# Patient Record
Sex: Female | Born: 1985 | Race: White | Hispanic: No | Marital: Married | Smoking: Never smoker
Health system: Southern US, Community
[De-identification: ages and names within clinical notes are randomized; demographics above are authoritative.]

## PROBLEM LIST (undated history)

## (undated) DIAGNOSIS — K219 Gastro-esophageal reflux disease without esophagitis: Secondary | ICD-10-CM

## (undated) DIAGNOSIS — E039 Hypothyroidism, unspecified: Secondary | ICD-10-CM

## (undated) DIAGNOSIS — F419 Anxiety disorder, unspecified: Secondary | ICD-10-CM

## (undated) DIAGNOSIS — E785 Hyperlipidemia, unspecified: Secondary | ICD-10-CM

## (undated) DIAGNOSIS — Z8489 Family history of other specified conditions: Secondary | ICD-10-CM

## (undated) DIAGNOSIS — R519 Headache, unspecified: Secondary | ICD-10-CM

## (undated) DIAGNOSIS — D649 Anemia, unspecified: Secondary | ICD-10-CM

## (undated) DIAGNOSIS — Z01419 Encounter for gynecological examination (general) (routine) without abnormal findings: Secondary | ICD-10-CM

## (undated) DIAGNOSIS — E559 Vitamin D deficiency, unspecified: Secondary | ICD-10-CM

## (undated) DIAGNOSIS — E669 Obesity, unspecified: Secondary | ICD-10-CM

## (undated) HISTORY — DX: Gastro-esophageal reflux disease without esophagitis: K21.9

## (undated) HISTORY — DX: Obesity, unspecified: E66.9

## (undated) HISTORY — DX: Hypothyroidism, unspecified: E03.9

## (undated) HISTORY — DX: Encounter for gynecological examination (general) (routine) without abnormal findings: Z01.419

---

## 2008-05-10 HISTORY — PX: UPPER GASTROINTESTINAL ENDOSCOPY: SHX188

## 2009-02-02 ENCOUNTER — Emergency Department: Payer: Self-pay | Admitting: Internal Medicine

## 2009-02-21 ENCOUNTER — Ambulatory Visit: Payer: Self-pay | Admitting: Gastroenterology

## 2009-05-10 DIAGNOSIS — Z01419 Encounter for gynecological examination (general) (routine) without abnormal findings: Secondary | ICD-10-CM

## 2009-05-10 HISTORY — DX: Encounter for gynecological examination (general) (routine) without abnormal findings: Z01.419

## 2009-12-19 LAB — HM PAP SMEAR: HM Pap smear: NORMAL

## 2010-12-14 ENCOUNTER — Ambulatory Visit: Payer: Self-pay

## 2011-01-22 ENCOUNTER — Ambulatory Visit (INDEPENDENT_AMBULATORY_CARE_PROVIDER_SITE_OTHER): Payer: BC Managed Care – PPO | Admitting: Internal Medicine

## 2011-01-22 ENCOUNTER — Encounter: Payer: Self-pay | Admitting: Internal Medicine

## 2011-01-22 DIAGNOSIS — N920 Excessive and frequent menstruation with regular cycle: Secondary | ICD-10-CM

## 2011-01-22 DIAGNOSIS — J329 Chronic sinusitis, unspecified: Secondary | ICD-10-CM

## 2011-01-22 DIAGNOSIS — E039 Hypothyroidism, unspecified: Secondary | ICD-10-CM | POA: Insufficient documentation

## 2011-01-22 MED ORDER — LEVOTHYROXINE SODIUM 50 MCG PO TABS: 50.0000 ug | ORAL_TABLET | Freq: Every day | ORAL | Status: DC

## 2011-01-22 MED ORDER — AMOXICILLIN-POT CLAVULANATE 875-125 MG PO TABS: 1.0000 | ORAL_TABLET | Freq: Two times a day (BID) | ORAL | Status: AC

## 2011-01-22 NOTE — Progress Notes (Signed)
Subjective:    Patient ID: Kristi [name], female    DOB: [birth year], 25 y.o.   MRN: [id number]  HPI Kristi [name] is a 25 year old female who presents to establish care. She was recently seen in employee health at [facility] and had lab work which showed an elevated TSH of 10. She reports a long-standing history of fatigue and difficulty losing weight. She reports being tested for hypothyroidism in the past however this was several years ago. She also notes intolerance to temperature, noting that she is always cold. She denies any constipation. She notes very heavy periods which last for 7 days. She has a strong family history of hypothyroidism including her mother.  She is also concerned because over the last week she has had green purulent nasal drainage. She also describes sinus pressure. She denies any fever. She has a history of recurrent sinusitis in her symptoms are consistent with her typical sinusitis symptoms.  Kristi [name] also reports that she occasionally notes that her toes turn purple color when she crosses her legs a certain way or in cold environments. She notes that her grandmother has similar symptoms. She denies any pain in her feet with the color change. She denies any pain or color change in her hands.    Review of Systems  Constitutional: Positive for fatigue and unexpected weight change (difficulty losing weight). Negative for fever, chills and appetite change.  HENT: Positive for congestion, rhinorrhea (purulent), postnasal drip and sinus pressure. Negative for ear pain, sore throat, trouble swallowing, neck pain and voice change.   Eyes: Negative for visual disturbance.  Respiratory: Negative for cough, shortness of breath, wheezing and stridor.   Cardiovascular: Negative for chest pain, palpitations and leg swelling.  Gastrointestinal: Negative for nausea, vomiting, abdominal pain, diarrhea, constipation, blood in stool, abdominal  distention and anal bleeding.  Genitourinary: Negative for dysuria and flank pain.  Musculoskeletal: Negative for myalgias, arthralgias and gait problem.  Skin: Negative for color change and rash.  Neurological: Negative for dizziness and headaches.  Hematological: Negative for adenopathy. Does not bruise/bleed easily.  Psychiatric/Behavioral: Negative for suicidal ideas, sleep disturbance and dysphoric mood. The patient is not nervous/anxious.    BP 122/82  Pulse 85  Temp(Src) 98.2 F (36.8 C) (Oral)  Resp 20  Ht 5\' 9"  (1.753 m)  Wt 207 lb 4 oz (94.008 kg)  BMI 30.61 kg/m2  SpO2 100%  LMP 01/08/2011     Objective:   Physical Exam  Constitutional: She is oriented to person, place, and time. She appears well-developed and well-nourished. No distress.  HENT:  Head: Normocephalic and atraumatic.  Right Ear: Hearing, tympanic membrane, external ear and ear canal normal.  Left Ear: Hearing, tympanic membrane, external ear and ear canal normal.  Nose: Mucosal edema present. Right sinus exhibits no maxillary sinus tenderness and no frontal sinus tenderness. Left sinus exhibits no maxillary sinus tenderness and no frontal sinus tenderness.  Mouth/Throat: Oropharynx is clear and moist and mucous membranes are normal. No oropharyngeal exudate or posterior oropharyngeal erythema.  Eyes: Conjunctivae and EOM are normal. Pupils are equal, round, and reactive to light. Right eye exhibits no discharge. Left eye exhibits no discharge. No scleral icterus.  Neck: Trachea normal and normal range of motion. Neck supple. Carotid bruit is not present. No tracheal deviation present. No mass and no thyromegaly present.  Cardiovascular: Normal rate, regular rhythm, normal heart sounds and intact distal pulses.  Exam reveals no gallop and no friction rub.   No murmur  heard. Pulmonary/Chest: Effort normal and breath sounds normal. No respiratory distress. She has no wheezes. She has no rales. She exhibits no  tenderness.  Abdominal: Soft. Normal appearance. There is no hepatosplenomegaly. There is no tenderness.  Musculoskeletal: Normal range of motion. She exhibits no edema and no tenderness.  Lymphadenopathy:    She has no cervical adenopathy.  Neurological: She is alert and oriented to person, place, and time. She has normal strength. No cranial nerve deficit. She exhibits normal muscle tone. Coordination normal.  Skin: Skin is warm and dry. No rash noted. She is not diaphoretic. No erythema. No pallor.  Psychiatric: She has a normal mood and affect. Her behavior is normal. Judgment and thought content normal.          Assessment & Plan:  1. Hypothyroidism -  patient with new diagnosis of hypothyroidism based on screening labs performed at employee health. Will start treatment with Synthroid 50 mcg daily. She was instructed to take this medication with water before breakfast. We discussed the symptoms and causes of hypothyroidism. We will plan to repeat a TSH in 6 weeks. She will followup in a week's period   2. Sinusitis - patient with acute maxillary sinusitis. Will treat with Augmentin. She will use ibuprofen for pain and inflammation. She will call if symptoms fail to improve over the next several days.  3. Raynaud's - patient's symptom of purple discoloration of her toes in cold temperatures is most consistent with Raynaud's. We discussed avoiding cold temperatures. Should she develop any pain or worsening of her symptoms we will consider additional evaluation.   4. Menorrhagia - patient with extremely heavy menstrual cycles lasting 7 days each month. We will check a screening CBC to look for iron deficiency with next lab work. She has tried using oral contraceptives to help control menstrual flow in the past, but had symptoms of lightheadedness when taking birth control pills. Suspect that her menstrual cycles may improve with treatment of hypothyroidism.

## 2011-01-22 NOTE — Patient Instructions (Signed)
Hypothyroidism The thyroid is a large gland located in the lower front of your neck. The thyroid gland helps control metabolism. Metabolism is how your body handles food. It controls metabolism with the hormone thyroxine. When this gland is underactive (hypothyroid), it produces too little hormone.  SYMPTOMS OF HYPOTHYROIDISM  Lethargy (feeling as though you have no energy)   Cold intolerance   Weight gain (in spite of normal food intake)   Dry skin   Coarse hair   Menstrual irregularity (if severe, may lead to infertility)   Slowing of thought processes  Cardiac problems are also caused by insufficient amounts of thyroid hormone. Hypothyroidism in the newborn is cretinism, and is an extreme form. It is important that this form be treated adequately and immediately or it will lead rapidly to retarded physical and mental development. CAUSES OF HYPOTHYROIDISM These include:   Absence or destruction of thyroid tissue.  Goiter due to iodine deficiency.   Goiter due to medications.   Congenital defects (since birth).  Problems with the pituitary. This causes a lack of TSH (thyroid stimulating hormone). This hormone tells the thyroid to turn out more hormone.   DIAGNOSIS To prove hypothyroidism, your caregiver may do blood tests and ultrasound tests. Sometimes the signs are hidden. It may be necessary for your caregiver to watch this illness with blood tests either before or after diagnosis and treatment. TREATMENT  Low levels of thyroid hormone are increased by using synthetic thyroid hormone. This is a safe, effective treatment. It usually takes about four weeks to gain the full effects of the medication. After you have the full effect of the medication, it will generally take another four weeks for problems to leave. Your caregiver may start you on low doses. If you have had heart problems the dose may be gradually increased. It is generally not an emergency to get rapidly to  normal. HOME CARE INSTRUCTIONS  Take your medications as your caregiver suggests. Let your caregiver know of any medications you are taking or start taking. Your caregiver will help you with dosage schedules.   As your condition improves, your dosage needs may increase. It will be necessary to have continuing blood tests as suggested by your caregiver.   Report all suspected medication side effects to your caregiver.  SEEK MEDICAL CARE IF YOU DEVELOP:  Sweating.  Tremulousness (tremors).   Anxiety.   Rapid weight loss.   Heat intolerance.  Emotional swings.   Diarrhea.   Weakness.   SEEK IMMEDIATE MEDICAL CARE IF: You develop chest pain, an irregular heart beat (palpitations), or a rapid heart beat. MAKE SURE YOU:   Understand these instructions.   Will watch your condition.   Will get help right away if you are not doing well or get worse.  Document Released: 04/26/2005 Document Re-Released: 04/08/2008 Exit[employer] Patient Information 2011 [employer]. 

## 2011-02-04 ENCOUNTER — Telehealth: Payer: Self-pay | Admitting: Internal Medicine

## 2011-02-04 MED ORDER — NORETHIN ACE-ETH ESTRAD-FE 1-20 MG-MCG(24) PO TABS: 1.0000 | ORAL_TABLET | Freq: Every day | ORAL | Status: DC

## 2011-02-04 NOTE — Telephone Encounter (Signed)
Faxed in Rx 

## 2011-02-04 NOTE — Telephone Encounter (Signed)
Patient called and stated she wants to try Loestrin 24  She really likes it and her menstrual cycle is getting irregular she has had 2 menstrual cycles in a month.  Please advise.

## 2011-02-04 NOTE — Telephone Encounter (Signed)
Fine to call in Loestrin 24 for her. Disp 1 pack, refill 11.

## 2011-03-03 ENCOUNTER — Telehealth: Payer: Self-pay | Admitting: Internal Medicine

## 2011-03-03 NOTE — Telephone Encounter (Signed)
Fine to call in Aviane 1 tab daily disp 1 package, refill 11.

## 2011-03-03 NOTE — Telephone Encounter (Signed)
She would like to try the Avaine [facility].  She stated she has tried [place] but they gave her headaches and makes her heart race.

## 2011-03-03 NOTE — Telephone Encounter (Signed)
There is not a specific generic for this pill, but there are generic birth control pills such as Aviane. We can try this one if she would like.

## 2011-03-03 NOTE — Telephone Encounter (Signed)
Patient's mother called and stated her birth control Lo-estrin is 51 dollars and wanted to know if she could try the generic if there is one.  Please advise.

## 2011-03-05 MED ORDER — LEVONORGESTREL-ETHINYL ESTRAD 0.1-20 MG-MCG PO TABS: 1.0000 | ORAL_TABLET | Freq: Every day | ORAL | Status: DC

## 2011-03-05 NOTE — Telephone Encounter (Signed)
Rx sent in, Unable to contact patient, no answer, no VM

## 2011-03-24 ENCOUNTER — Telehealth: Payer: Self-pay | Admitting: Internal Medicine

## 2011-03-24 DIAGNOSIS — E039 Hypothyroidism, unspecified: Secondary | ICD-10-CM

## 2011-03-24 DIAGNOSIS — E049 Nontoxic goiter, unspecified: Secondary | ICD-10-CM

## 2011-03-24 NOTE — Telephone Encounter (Signed)
The orders are ready, patient notified.

## 2011-03-24 NOTE — Telephone Encounter (Signed)
Fine to order TSH and ultrasound of thyroid at hospital

## 2011-03-24 NOTE — Telephone Encounter (Signed)
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  Patient also stated when she swallows it feels like she is choking, patient stated she cannot feel a lump or knot in her throat.  She just wanted you to be aware since her mother had the same thing, and you ordered an ultrasound on her.

## 2011-04-02 ENCOUNTER — Ambulatory Visit: Payer: Self-pay | Admitting: Internal Medicine

## 2011-04-05 ENCOUNTER — Telehealth: Payer: Self-pay | Admitting: Internal Medicine

## 2011-04-05 NOTE — Telephone Encounter (Signed)
Thyroid US was normal.

## 2011-04-06 NOTE — Telephone Encounter (Signed)
Patient informed. 

## 2011-04-15 ENCOUNTER — Encounter: Payer: Self-pay | Admitting: Internal Medicine

## 2011-06-28 ENCOUNTER — Ambulatory Visit (INDEPENDENT_AMBULATORY_CARE_PROVIDER_SITE_OTHER): Payer: BC Managed Care – PPO | Admitting: Internal Medicine

## 2011-06-28 ENCOUNTER — Encounter: Payer: Self-pay | Admitting: Internal Medicine

## 2011-06-28 VITALS — BP 110/72 | HR 112 | Temp 98.6°F | Ht 70.0 in | Wt 204.0 lb

## 2011-06-28 DIAGNOSIS — N920 Excessive and frequent menstruation with regular cycle: Secondary | ICD-10-CM

## 2011-06-28 DIAGNOSIS — J40 Bronchitis, not specified as acute or chronic: Secondary | ICD-10-CM

## 2011-06-28 DIAGNOSIS — E039 Hypothyroidism, unspecified: Secondary | ICD-10-CM

## 2011-06-28 LAB — CBC WITH DIFFERENTIAL/PLATELET
Basophils Relative: 0.4 % (ref 0.0–3.0)
Eosinophils Absolute: 0.2 10*3/uL (ref 0.0–0.7)
HCT: 40.3 % (ref 36.0–46.0)
Hemoglobin: 13.9 g/dL (ref 12.0–15.0)
Lymphocytes Relative: 14.7 % (ref 12.0–46.0)
Lymphs Abs: 1.2 10*3/uL (ref 0.7–4.0)
MCHC: 34.4 g/dL (ref 30.0–36.0)
MCV: 86.5 fl (ref 78.0–100.0)
Monocytes Absolute: 0.6 10*3/uL (ref 0.1–1.0)
Neutro Abs: 6.3 10*3/uL (ref 1.4–7.7)
RBC: 4.66 Mil/uL (ref 3.87–5.11)

## 2011-06-28 MED ORDER — BENZONATATE 200 MG PO CAPS: 200.0000 mg | ORAL_CAPSULE | Freq: Two times a day (BID) | ORAL | Status: AC | PRN

## 2011-06-28 MED ORDER — AZITHROMYCIN 250 MG PO TABS: ORAL_TABLET | ORAL | Status: AC

## 2011-06-28 NOTE — Progress Notes (Signed)
Subjective:    Patient ID: Kristi [name], female    DOB: December 04, 1985, 26 y.o.   MRN: [id number]  HPI 26 year old female presents for acute visit. She has 2 concerns today. First, she reports approximately 5 day history of nasal congestion, nonproductive cough, and some wheezing. She notes she has a history of bronchitis in the past. She is not a smoker. She has never been hospitalized for asthma. She has been taking over-the-counter cough and cold preparations with no improvement in her symptoms. She denies any fever or chills. She denies shortness of breath.  She is also concerned today about her thyroid function. She was noted to have hypothyroidism based on screening labs performed at her work. She was started on Synthroid 50 mcg daily. She then had repeat levels strong in November 2012, but the results of these tests are not available at the time of her appointment. She continues to have difficulty losing weight and some fatigue. She is concerned her thyroid function may be low.  Outpatient Encounter Prescriptions as of 06/28/2011  Medication Sig Dispense Refill  . levonorgestrel-ethinyl estradiol (AVIANE,ALESSE,LESSINA) 0.1-20 MG-MCG tablet Take 1 tablet by mouth daily.  1 Package  11  . levothyroxine (SYNTHROID) 50 MCG tablet Take 1 tablet (50 mcg total) by mouth daily.  30 tablet  11  . azithromycin (ZITHROMAX Z-PAK) 250 MG tablet Take 2 pills day 1, then 1 pill daily days 2-5  6 each  0  . benzonatate (TESSALON) 200 MG capsule Take 1 capsule (200 mg total) by mouth 2 (two) times daily as needed for cough.  20 capsule  0  . DISCONTD: Norethindrone Acetate-Ethinyl Estrad-FE (LOESTRIN 24 FE) 1-20 MG-MCG(24) tablet Take 1 tablet by mouth daily.  1 Package  11    Review of Systems  Constitutional: Positive for fatigue. Negative for fever, chills and unexpected weight change.  HENT: Positive for congestion and rhinorrhea. Negative for hearing loss, ear pain, nosebleeds, sore throat, facial  swelling, sneezing, mouth sores, trouble swallowing, neck pain, neck stiffness, voice change, postnasal drip, sinus pressure, tinnitus and ear discharge.   Eyes: Negative for pain, discharge, redness and visual disturbance.  Respiratory: Positive for cough and wheezing. Negative for chest tightness, shortness of breath and stridor.   Cardiovascular: Negative for chest pain, palpitations and leg swelling.  Musculoskeletal: Negative for myalgias and arthralgias.  Skin: Negative for color change and rash.  Neurological: Negative for dizziness, weakness, light-headedness and headaches.  Hematological: Negative for adenopathy.   BP 110/72  Pulse 112  Temp(Src) 98.6 F (37 C) (Oral)  Ht 5\' 10"  (1.778 m)  Wt 204 lb (92.534 kg)  BMI 29.27 kg/m2  SpO2 98%     Objective:   Physical Exam  Constitutional: She is oriented to person, place, and time. She appears well-developed and well-nourished. No distress.  HENT:  Head: Normocephalic and atraumatic.  Right Ear: External ear normal.  Left Ear: External ear normal.  Nose: Nose normal.  Mouth/Throat: Oropharynx is clear and moist. No oropharyngeal exudate.  Eyes: Conjunctivae are normal. Pupils are equal, round, and reactive to light. Right eye exhibits no discharge. Left eye exhibits no discharge. No scleral icterus.  Neck: Normal range of motion. Neck supple. No tracheal deviation present. No thyromegaly present.  Cardiovascular: Normal rate, regular rhythm, normal heart sounds and intact distal pulses.  Exam reveals no gallop and no friction rub.   No murmur heard. Pulmonary/Chest: Effort normal. No accessory muscle usage. Not tachypneic. No respiratory distress. She has no decreased breath  sounds. She has no wheezes. She has rhonchi in the right middle field. She has no rales. She exhibits no tenderness.  Musculoskeletal: Normal range of motion. She exhibits no edema and no tenderness.  Lymphadenopathy:    She has no cervical adenopathy.    Neurological: She is alert and oriented to person, place, and time. No cranial nerve deficit. She exhibits normal muscle tone. Coordination normal.  Skin: Skin is warm and dry. No rash noted. She is not diaphoretic. No erythema. No pallor.  Psychiatric: She has a normal mood and affect. Her behavior is normal. Judgment and thought content normal.          Assessment & Plan:

## 2011-06-28 NOTE — Assessment & Plan Note (Signed)
Will treat with Azithromycin and use tessalon prn cough. Follow up if symptoms not improving over next 48hr.

## 2011-06-28 NOTE — Assessment & Plan Note (Signed)
Will recheck TSH today.  Continue Synthroid. Follow up 26month.

## 2011-07-02 ENCOUNTER — Telehealth: Payer: Self-pay | Admitting: *Deleted

## 2011-07-02 MED ORDER — LEVOTHYROXINE SODIUM 75 MCG PO TABS: 75.0000 ug | ORAL_TABLET | Freq: Every day | ORAL | Status: DC

## 2011-07-02 NOTE — Telephone Encounter (Signed)
Message copied by Vernie Murders on Fri Jul 02, 2011  5:34 PM ------      Message from: Ronna Polio A      Created: Mon Jun 28, 2011  1:30 PM       TSH is just slightly high, still in normal range though. We can try increasing Synthroid to daily and then repeat TSH in 1 month, to see if symptoms improved.

## 2011-08-03 ENCOUNTER — Other Ambulatory Visit (INDEPENDENT_AMBULATORY_CARE_PROVIDER_SITE_OTHER): Payer: BC Managed Care – PPO | Admitting: *Deleted

## 2011-08-03 DIAGNOSIS — E039 Hypothyroidism, unspecified: Secondary | ICD-10-CM

## 2011-08-30 ENCOUNTER — Encounter: Payer: Self-pay | Admitting: Internal Medicine

## 2011-09-01 ENCOUNTER — Ambulatory Visit (INDEPENDENT_AMBULATORY_CARE_PROVIDER_SITE_OTHER): Payer: BC Managed Care – PPO | Admitting: Internal Medicine

## 2011-09-01 ENCOUNTER — Encounter: Payer: Self-pay | Admitting: Internal Medicine

## 2011-09-01 VITALS — BP 110/76 | HR 69 | Ht 70.0 in | Wt 206.0 lb

## 2011-09-01 DIAGNOSIS — E663 Overweight: Secondary | ICD-10-CM | POA: Insufficient documentation

## 2011-09-01 MED ORDER — PHENTERMINE HCL 37.5 MG PO CAPS: 37.5000 mg | ORAL_CAPSULE | ORAL | Status: DC

## 2011-09-01 NOTE — Progress Notes (Signed)
  Subjective:    Patient ID: Kristi [name], female    DOB: 07/24/1985, 26 y.o.   MRN: [id number]  HPI 26 year old female with history of hypothyroidism presents to discuss difficulty losing weight. She notes over the last few months she has been using a food diary and calculating caloric intake without much improvement in weight. She has been limiting caloric intake to 1100 per day. She has also been increasing her physical activity with regular exercise. She notes that her weight has been relatively unchanged.  Outpatient Encounter Prescriptions as of 09/01/2011  Medication Sig Dispense Refill  . cimetidine (TAGAMET) 800 MG tablet Take 1 tablet by mouth Daily.      Marland Kitchen levonorgestrel-ethinyl estradiol (AVIANE,ALESSE,LESSINA) 0.1-20 MG-MCG tablet Take 1 tablet by mouth daily.  1 Package  11  . levothyroxine (SYNTHROID, LEVOTHROID) 75 MCG tablet Take 1 tablet (75 mcg total) by mouth daily.  90 tablet  1  . phentermine 37.5 MG capsule Take 1 capsule (37.5 mg total) by mouth every morning.  30 capsule  1    Review of Systems  Constitutional: Negative for fever, chills, appetite change, fatigue and unexpected weight change.  HENT: Negative for ear pain, congestion, sore throat, trouble swallowing, neck pain, voice change and sinus pressure.   Eyes: Negative for visual disturbance.  Respiratory: Negative for cough, shortness of breath, wheezing and stridor.   Cardiovascular: Negative for chest pain, palpitations and leg swelling.  Gastrointestinal: Negative for nausea, vomiting, abdominal pain, diarrhea, constipation, blood in stool, abdominal distention and anal bleeding.  Genitourinary: Negative for dysuria and flank pain.  Musculoskeletal: Negative for myalgias, arthralgias and gait problem.  Skin: Negative for color change and rash.  Neurological: Negative for dizziness and headaches.  Hematological: Negative for adenopathy. Does not bruise/bleed easily.  Psychiatric/Behavioral: Negative  for suicidal ideas, sleep disturbance and dysphoric mood. The patient is not nervous/anxious.    BP 110/76  Pulse 69  Ht 5\' 10"  (1.778 m)  Wt 206 lb (93.441 kg)  BMI 29.56 kg/m2  LMP 08/11/2011     Objective:   Physical Exam  Constitutional: She is oriented to person, place, and time. She appears well-developed and well-nourished. No distress.  HENT:  Head: Normocephalic and atraumatic.  Right Ear: External ear normal.  Left Ear: External ear normal.  Nose: Nose normal.  Mouth/Throat: Oropharynx is clear and moist. No oropharyngeal exudate.  Eyes: Conjunctivae are normal. Pupils are equal, round, and reactive to light. Right eye exhibits no discharge. Left eye exhibits no discharge. No scleral icterus.  Neck: Normal range of motion. Neck supple. No tracheal deviation present. No thyromegaly present.  Cardiovascular: Normal rate, regular rhythm, normal heart sounds and intact distal pulses.  Exam reveals no gallop and no friction rub.   No murmur heard. Pulmonary/Chest: Effort normal and breath sounds normal. No respiratory distress. She has no wheezes. She has no rales. She exhibits no tenderness.  Musculoskeletal: Normal range of motion. She exhibits no edema and no tenderness.  Lymphadenopathy:    She has no cervical adenopathy.  Neurological: She is alert and oriented to person, place, and time. No cranial nerve deficit. She exhibits normal muscle tone. Coordination normal.  Skin: Skin is warm and dry. No rash noted. She is not diaphoretic. No erythema. No pallor.  Psychiatric: She has a normal mood and affect. Her behavior is normal. Judgment and thought content normal.          Assessment & Plan:

## 2011-09-01 NOTE — Assessment & Plan Note (Signed)
Encouraged patient to continue efforts at food diary and regular physical exercise. Will start phentermine to help with appetite suppression. Patient will call if any problems with this medication. She will followup in one month to recheck weight and blood pressure.

## 2011-10-06 ENCOUNTER — Encounter: Payer: Self-pay | Admitting: Internal Medicine

## 2011-10-06 ENCOUNTER — Ambulatory Visit (INDEPENDENT_AMBULATORY_CARE_PROVIDER_SITE_OTHER): Payer: BC Managed Care – PPO | Admitting: Internal Medicine

## 2011-10-06 VITALS — BP 116/80 | HR 78 | Temp 97.8°F | Ht 70.0 in | Wt 193.8 lb

## 2011-10-06 DIAGNOSIS — E663 Overweight: Secondary | ICD-10-CM

## 2011-10-06 MED ORDER — PHENTERMINE HCL 37.5 MG PO CAPS: 37.5000 mg | ORAL_CAPSULE | ORAL | Status: DC

## 2011-10-06 NOTE — Progress Notes (Signed)
Subjective:    Patient ID: Kristi [name], female    DOB: Mar 17, 1986, 26 y.o.   MRN: [id number]  HPI 26 year old female with history of being overweight presents for followup. At her last visit, she was started on phentermine. She reports significant improvement in her appetite on this medication. She did notice dry mouth, constipation, and difficulty sleeping on the first night when she took the medicine. This has resolved. Otherwise, she reports she is feeling well. She has also been limiting her caloric intake and using an on line application to count calories. She has not yet started an exercise program.  Outpatient Encounter Prescriptions as of 10/06/2011  Medication Sig Dispense Refill  . levonorgestrel-ethinyl estradiol (AVIANE,ALESSE,LESSINA) 0.1-20 MG-MCG tablet Take 1 tablet by mouth daily.  1 Package  11  . levothyroxine (SYNTHROID, LEVOTHROID) 75 MCG tablet Take 1 tablet (75 mcg total) by mouth daily.  90 tablet  1  . phentermine (ADIPEX-P) 37.5 MG tablet Take 37.5 mg by mouth daily before breakfast.      . cimetidine (TAGAMET) 800 MG tablet Take 1 tablet by mouth Daily.        Review of Systems  Constitutional: Negative for fever, chills, appetite change, fatigue and unexpected weight change.  HENT: Negative for ear pain, congestion, sore throat, trouble swallowing, neck pain, voice change and sinus pressure.   Eyes: Negative for visual disturbance.  Respiratory: Negative for cough, shortness of breath, wheezing and stridor.   Cardiovascular: Negative for chest pain, palpitations and leg swelling.  Gastrointestinal: Negative for nausea, vomiting, abdominal pain, diarrhea, constipation, blood in stool, abdominal distention and anal bleeding.  Genitourinary: Negative for dysuria and flank pain.  Musculoskeletal: Negative for myalgias, arthralgias and gait problem.  Skin: Negative for color change and rash.  Neurological: Negative for dizziness and headaches.  Hematological:  Negative for adenopathy. Does not bruise/bleed easily.  Psychiatric/Behavioral: Positive for sleep disturbance. Negative for suicidal ideas and dysphoric mood. The patient is not nervous/anxious.    BP 116/80  Pulse 78  Temp(Src) 97.8 F (36.6 C) (Oral)  Ht 5\' 10"  (1.778 m)  Wt 193 lb 12 oz (87.884 kg)  BMI 27.80 kg/m2  SpO2 100%  LMP 09/05/2011     Objective:   Physical Exam  Constitutional: She is oriented to person, place, and time. She appears well-developed and well-nourished. No distress.  HENT:  Head: Normocephalic and atraumatic.  Right Ear: External ear normal.  Left Ear: External ear normal.  Nose: Nose normal.  Mouth/Throat: Oropharynx is clear and moist. No oropharyngeal exudate.  Eyes: Conjunctivae are normal. Pupils are equal, round, and reactive to light. Right eye exhibits no discharge. Left eye exhibits no discharge. No scleral icterus.  Neck: Normal range of motion. Neck supple. No tracheal deviation present. No thyromegaly present.  Cardiovascular: Normal rate, regular rhythm, normal heart sounds and intact distal pulses.  Exam reveals no gallop and no friction rub.   No murmur heard. Pulmonary/Chest: Effort normal and breath sounds normal. No respiratory distress. She has no wheezes. She has no rales. She exhibits no tenderness.  Musculoskeletal: Normal range of motion. She exhibits no edema and no tenderness.  Lymphadenopathy:    She has no cervical adenopathy.  Neurological: She is alert and oriented to person, place, and time. No cranial nerve deficit. She exhibits normal muscle tone. Coordination normal.  Skin: Skin is warm and dry. No rash noted. She is not diaphoretic. No erythema. No pallor.  Psychiatric: She has a normal mood and affect. Her behavior  is normal. Judgment and thought content normal.          Assessment & Plan:

## 2011-10-06 NOTE — Assessment & Plan Note (Signed)
Congratulated patient on 13 pound weight loss. Encouraged her to continue efforts at keeping a food diary and to add and regular physical exercise. Will continue phentermine. Patient will followup in one month.

## 2011-11-10 ENCOUNTER — Ambulatory Visit (INDEPENDENT_AMBULATORY_CARE_PROVIDER_SITE_OTHER): Payer: BC Managed Care – PPO | Admitting: Internal Medicine

## 2011-11-10 ENCOUNTER — Encounter: Payer: Self-pay | Admitting: Internal Medicine

## 2011-11-10 VITALS — BP 120/86 | HR 76 | Temp 98.7°F | Ht 70.0 in | Wt 184.8 lb

## 2011-11-10 DIAGNOSIS — E663 Overweight: Secondary | ICD-10-CM

## 2011-11-10 MED ORDER — PHENTERMINE HCL 37.5 MG PO TABS: 37.5000 mg | ORAL_TABLET | Freq: Every day | ORAL | Status: DC

## 2011-11-10 NOTE — Assessment & Plan Note (Signed)
Congratulated patient on 9 pound weight loss. Encouraged her to continue efforts at keeping a food diary and to add and regular physical exercise. Will continue phentermine. Plan to taper off phentermine when BMI<25. Patient will followup in one month.

## 2011-11-10 NOTE — Progress Notes (Signed)
Subjective:    Patient ID: Kristi [name], female    DOB: 06-09-1985, 26 y.o.   MRN: [id number]  HPI 26YO female presents to follow up on weight loss.  She has lost another 9lbs. Continues to use [employer] to count calories and has used phentermine for appetite suppression. Only side effect from phentermine is dry mouth. Feeling well otherwise. No other concerns.  Outpatient Encounter Prescriptions as of 11/10/2011  Medication Sig Dispense Refill  . cimetidine (TAGAMET) 800 MG tablet Take 1 tablet by mouth Daily.      Marland Kitchen levonorgestrel-ethinyl estradiol (AVIANE,ALESSE,LESSINA) 0.1-20 MG-MCG tablet Take 1 tablet by mouth daily.  1 Package  11  . levothyroxine (SYNTHROID, LEVOTHROID) 75 MCG tablet Take 1 tablet (75 mcg total) by mouth daily.  90 tablet  1  . phentermine (ADIPEX-P) 37.5 MG tablet Take 1 tablet (37.5 mg total) by mouth daily before breakfast.  30 tablet  1  . DISCONTD: phentermine (ADIPEX-P) 37.5 MG tablet Take 37.5 mg by mouth daily before breakfast.        Review of Systems  Constitutional: Negative for fever, chills, appetite change, fatigue and unexpected weight change.  HENT: Negative for ear pain, congestion, sore throat, trouble swallowing, neck pain, voice change and sinus pressure.   Eyes: Negative for visual disturbance.  Respiratory: Negative for cough, shortness of breath, wheezing and stridor.   Cardiovascular: Negative for chest pain, palpitations and leg swelling.  Gastrointestinal: Negative for nausea, vomiting, abdominal pain, diarrhea, constipation, blood in stool, abdominal distention and anal bleeding.  Genitourinary: Negative for dysuria and flank pain.  Musculoskeletal: Negative for myalgias, arthralgias and gait problem.  Skin: Negative for color change and rash.  Neurological: Negative for dizziness and headaches.  Hematological: Negative for adenopathy. Does not bruise/bleed easily.  Psychiatric/Behavioral: Negative for suicidal ideas, disturbed  wake/sleep cycle and dysphoric mood. The patient is not nervous/anxious.    BP 120/86  Pulse 76  Temp 98.7 F (37.1 C) (Oral)  Ht 5\' 10"  (1.778 m)  Wt 184 lb 12 oz (83.802 kg)  BMI 26.51 kg/m2  LMP 11/03/2011     Objective:   Physical Exam  Constitutional: She is oriented to person, place, and time. She appears well-developed and well-nourished. No distress.  HENT:  Head: Normocephalic and atraumatic.  Right Ear: External ear normal.  Left Ear: External ear normal.  Nose: Nose normal.  Mouth/Throat: Oropharynx is clear and moist. No oropharyngeal exudate.  Eyes: Conjunctivae are normal. Pupils are equal, round, and reactive to light. Right eye exhibits no discharge. Left eye exhibits no discharge. No scleral icterus.  Neck: Normal range of motion. Neck supple. No tracheal deviation present. No thyromegaly present.  Cardiovascular: Normal rate, regular rhythm, normal heart sounds and intact distal pulses.  Exam reveals no gallop and no friction rub.   No murmur heard. Pulmonary/Chest: Effort normal and breath sounds normal. No respiratory distress. She has no wheezes. She has no rales. She exhibits no tenderness.  Musculoskeletal: Normal range of motion. She exhibits no edema and no tenderness.  Lymphadenopathy:    She has no cervical adenopathy.  Neurological: She is alert and oriented to person, place, and time. No cranial nerve deficit. She exhibits normal muscle tone. Coordination normal.  Skin: Skin is warm and dry. No rash noted. She is not diaphoretic. No erythema. No pallor.  Psychiatric: She has a normal mood and affect. Her behavior is normal. Judgment and thought content normal.          Assessment & Plan:

## 2011-12-10 ENCOUNTER — Ambulatory Visit (INDEPENDENT_AMBULATORY_CARE_PROVIDER_SITE_OTHER): Payer: PRIVATE HEALTH INSURANCE | Admitting: Internal Medicine

## 2011-12-10 ENCOUNTER — Encounter: Payer: Self-pay | Admitting: Internal Medicine

## 2011-12-10 VITALS — BP 120/80 | HR 92 | Temp 98.7°F | Ht 70.0 in | Wt 179.5 lb

## 2011-12-10 DIAGNOSIS — E663 Overweight: Secondary | ICD-10-CM

## 2011-12-10 MED ORDER — PHENTERMINE HCL 37.5 MG PO TABS: 37.5000 mg | ORAL_TABLET | Freq: Every day | ORAL | Status: DC

## 2011-12-10 NOTE — Assessment & Plan Note (Signed)
Congratulated pt on another 5 lb weight loss. Will plan to try to taper off over the next couple of months, given that BMI is now just over 25. Patient will continue to use on line food diary. She will continue with efforts at exercise. Followup one month.

## 2011-12-10 NOTE — Progress Notes (Signed)
Subjective:    Patient ID: Kristi [name], female    DOB: 04-Aug-1985, 26 y.o.   MRN: [id number]  HPI 26 year old female with history of being overweight presents for followup. Over the last month she has lost another 5 pounds. Her BMI today is 25.7. She reports that she continues to use an on line food diary to calculate daily calories. She is also continue phentermine to help with appetite suppression. She denies any noted side effects of the medication. She notes she is feeling well.  Outpatient Encounter Prescriptions as of 12/10/2011  Medication Sig Dispense Refill  . levonorgestrel-ethinyl estradiol (AVIANE,ALESSE,LESSINA) 0.1-20 MG-MCG tablet Take 1 tablet by mouth daily.  1 Package  11  . levothyroxine (SYNTHROID, LEVOTHROID) 75 MCG tablet Take 1 tablet (75 mcg total) by mouth daily.  90 tablet  1  . phentermine (ADIPEX-P) 37.5 MG tablet Take 1 tablet (37.5 mg total) by mouth daily before breakfast.  30 tablet  1  . DISCONTD: phentermine (ADIPEX-P) 37.5 MG tablet Take 1 tablet (37.5 mg total) by mouth daily before breakfast.  30 tablet  1  . cimetidine (TAGAMET) 800 MG tablet Take 1 tablet by mouth Daily.        Review of Systems  Constitutional: Negative for fever, chills, appetite change, fatigue and unexpected weight change.  HENT: Negative for ear pain, congestion, sore throat, trouble swallowing, neck pain, voice change and sinus pressure.   Eyes: Negative for visual disturbance.  Respiratory: Negative for cough, shortness of breath, wheezing and stridor.   Cardiovascular: Negative for chest pain, palpitations and leg swelling.  Gastrointestinal: Negative for nausea, vomiting, abdominal pain, diarrhea, constipation, blood in stool, abdominal distention and anal bleeding.  Genitourinary: Negative for dysuria and flank pain.  Musculoskeletal: Negative for myalgias, arthralgias and gait problem.  Skin: Negative for color change and rash.  Neurological: Negative for dizziness and  headaches.  Hematological: Negative for adenopathy. Does not bruise/bleed easily.  Psychiatric/Behavioral: Negative for suicidal ideas, disturbed wake/sleep cycle and dysphoric mood. The patient is not nervous/anxious.    BP 120/80  Pulse 92  Temp 98.7 F (37.1 C) (Oral)  Ht 5\' 10"  (1.778 m)  Wt 179 lb 8 oz (81.421 kg)  BMI 25.76 kg/m2  SpO2 98%  LMP 12/03/2011     Objective:   Physical Exam  Constitutional: She is oriented to person, place, and time. She appears well-developed and well-nourished. No distress.  HENT:  Head: Normocephalic and atraumatic.  Right Ear: External ear normal.  Left Ear: External ear normal.  Nose: Nose normal.  Mouth/Throat: Oropharynx is clear and moist. No oropharyngeal exudate.  Eyes: Conjunctivae are normal. Pupils are equal, round, and reactive to light. Right eye exhibits no discharge. Left eye exhibits no discharge. No scleral icterus.  Neck: Normal range of motion. Neck supple. No tracheal deviation present. No thyromegaly present.  Cardiovascular: Normal rate, regular rhythm, normal heart sounds and intact distal pulses.  Exam reveals no gallop and no friction rub.   No murmur heard. Pulmonary/Chest: Effort normal and breath sounds normal. No respiratory distress. She has no wheezes. She has no rales. She exhibits no tenderness.  Musculoskeletal: Normal range of motion. She exhibits no edema and no tenderness.  Lymphadenopathy:    She has no cervical adenopathy.  Neurological: She is alert and oriented to person, place, and time. No cranial nerve deficit. She exhibits normal muscle tone. Coordination normal.  Skin: Skin is warm and dry. No rash noted. She is not diaphoretic. No erythema. No pallor.  Psychiatric: She has a normal mood and affect. Her behavior is normal. Judgment and thought content normal.          Assessment & Plan:

## 2012-01-05 ENCOUNTER — Other Ambulatory Visit: Payer: Self-pay | Admitting: *Deleted

## 2012-01-05 MED ORDER — LEVOTHYROXINE SODIUM 75 MCG PO TABS: 75.0000 ug | ORAL_TABLET | Freq: Every day | ORAL | Status: DC

## 2012-01-07 ENCOUNTER — Ambulatory Visit: Payer: PRIVATE HEALTH INSURANCE | Admitting: Internal Medicine

## 2012-01-19 ENCOUNTER — Encounter: Payer: Self-pay | Admitting: Internal Medicine

## 2012-01-19 ENCOUNTER — Ambulatory Visit (INDEPENDENT_AMBULATORY_CARE_PROVIDER_SITE_OTHER): Payer: PRIVATE HEALTH INSURANCE | Admitting: Internal Medicine

## 2012-01-19 VITALS — BP 110/78 | HR 78 | Temp 98.6°F | Ht 70.0 in | Wt 179.5 lb

## 2012-01-19 DIAGNOSIS — H6693 Otitis media, unspecified, bilateral: Secondary | ICD-10-CM

## 2012-01-19 DIAGNOSIS — E663 Overweight: Secondary | ICD-10-CM

## 2012-01-19 DIAGNOSIS — E039 Hypothyroidism, unspecified: Secondary | ICD-10-CM

## 2012-01-19 DIAGNOSIS — H6690 Otitis media, unspecified, unspecified ear: Secondary | ICD-10-CM

## 2012-01-19 MED ORDER — ANTIPYRINE-BENZOCAINE 5.4-1.4 % OT SOLN: 3.0000 [drp] | OTIC | Status: DC | PRN

## 2012-01-19 MED ORDER — AMOXICILLIN-POT CLAVULANATE 875-125 MG PO TABS: 1.0000 | ORAL_TABLET | Freq: Two times a day (BID) | ORAL | Status: DC

## 2012-01-19 MED ORDER — ANTIPYRINE-BENZOCAINE 5.4-1.4 % OT SOLN: 3.0000 [drp] | OTIC | Status: AC | PRN

## 2012-01-19 MED ORDER — AMOXICILLIN-POT CLAVULANATE 875-125 MG PO TABS: 1.0000 | ORAL_TABLET | Freq: Two times a day (BID) | ORAL | Status: AC

## 2012-01-19 MED ORDER — HYDROCOD POLST-CHLORPHEN POLST 10-8 MG/5ML PO LQCR: 5.0000 mL | Freq: Two times a day (BID) | ORAL | Status: DC | PRN

## 2012-01-19 NOTE — Assessment & Plan Note (Signed)
Weight stable. BMI overall much improved at 25. Will taper down on phentermine, use prn.  Encouraged continued effort at healthy diet and regular physical activity. Follow up 2 months and prn.

## 2012-01-19 NOTE — Assessment & Plan Note (Signed)
Will plan to check TSH and free T4 prior to next visit in 2 months.

## 2012-01-19 NOTE — Progress Notes (Signed)
Subjective:    Patient ID: Kristi [name], female    DOB: [birth year], 26 y.o.   MRN: [id number]  HPI 26 year old female with history of hypothyroidism, weight gain presents for followup. In regards to her weight, she reports continued efforts at healthy diet and regular physical activity. She continues to occasionally use phentermine to help with appetite suppression. Weight is been stable over the last month.  She is concerned today about 3-4 day history of general malaise, fever, chills, nasal congestion, myalgia, and bilateral ear pain over the last 24 hours. She notes several sick contacts. She has been taking over-the-counter cough and cold medications with no improvement. She denies shortness of breath, chest pain.  Outpatient Encounter Prescriptions as of 01/19/2012  Medication Sig Dispense Refill  . levonorgestrel-ethinyl estradiol (AVIANE,ALESSE,LESSINA) 0.1-20 MG-MCG tablet Take 1 tablet by mouth daily.  1 Package  11  . levothyroxine (SYNTHROID, LEVOTHROID) 75 MCG tablet Take 1 tablet (75 mcg total) by mouth daily.  90 tablet  1  . phentermine (ADIPEX-P) 37.5 MG tablet Take 1 tablet (37.5 mg total) by mouth daily before breakfast.  30 tablet  1  . amoxicillin-clavulanate (AUGMENTIN) 875-125 MG per tablet Take 1 tablet by mouth 2 (two) times daily.  20 tablet  0  . antipyrine-benzocaine (AURALGAN) otic solution Place 3 drops into both ears every 2 (two) hours as needed for pain.  10 mL  0  . chlorpheniramine-HYDROcodone (TUSSIONEX) 10-8 MG/5ML LQCR Take 5 mLs by mouth every 12 (twelve) hours as needed.  140 mL  0   BP 110/78  Pulse 78  Temp 98.6 F (37 C) (Oral)  Ht 5\' 10"  (1.778 m)  Wt 179 lb 8 oz (81.421 kg)  BMI 25.76 kg/m2  SpO2 98%  LMP 01/05/2012  Review of Systems  Constitutional: Positive for fever, chills and fatigue. Negative for appetite change and unexpected weight change.  HENT: Positive for ear pain, congestion, rhinorrhea and sinus pressure. Negative for  sore throat, trouble swallowing, neck pain and voice change.   Eyes: Negative for visual disturbance.  Respiratory: Positive for cough. Negative for shortness of breath, wheezing and stridor.   Cardiovascular: Negative for chest pain, palpitations and leg swelling.  Gastrointestinal: Negative for nausea, vomiting, abdominal pain, diarrhea, constipation, blood in stool, abdominal distention and anal bleeding.  Genitourinary: Negative for dysuria and flank pain.  Musculoskeletal: Negative for myalgias, arthralgias and gait problem.  Skin: Negative for color change and rash.  Neurological: Negative for dizziness and headaches.  Hematological: Negative for adenopathy. Does not bruise/bleed easily.  Psychiatric/Behavioral: Negative for suicidal ideas, disturbed wake/sleep cycle and dysphoric mood. The patient is not nervous/anxious.        Objective:   Physical Exam  Constitutional: She is oriented to person, place, and time. She appears well-developed and well-nourished. No distress.  HENT:  Head: Normocephalic and atraumatic.  Right Ear: External ear normal. Tympanic membrane is erythematous and bulging. A middle ear effusion is present.  Left Ear: External ear normal. Tympanic membrane is erythematous and bulging. A middle ear effusion is present.  Nose: Nose normal.  Mouth/Throat: Posterior oropharyngeal erythema present. No oropharyngeal exudate.  Eyes: Conjunctivae normal are normal. Pupils are equal, round, and reactive to light. Right eye exhibits no discharge. Left eye exhibits no discharge. No scleral icterus.  Neck: Normal range of motion. Neck supple. No tracheal deviation present. No thyromegaly present.  Cardiovascular: Normal rate, regular rhythm, normal heart sounds and intact distal pulses.  Exam reveals no gallop and no friction  rub.   No murmur heard. Pulmonary/Chest: Effort normal and breath sounds normal. No respiratory distress. She has no wheezes. She has no rales. She  exhibits no tenderness.  Musculoskeletal: Normal range of motion. She exhibits no edema and no tenderness.  Lymphadenopathy:    She has no cervical adenopathy.  Neurological: She is alert and oriented to person, place, and time. No cranial nerve deficit. She exhibits normal muscle tone. Coordination normal.  Skin: Skin is warm and dry. No rash noted. She is not diaphoretic. No erythema. No pallor.  Psychiatric: She has a normal mood and affect. Her behavior is normal. Judgment and thought content normal.          Assessment & Plan:

## 2012-01-19 NOTE — Assessment & Plan Note (Signed)
Pt with bilateral OM, likely secondary bacterial infection after recent viral URI. Will treat with augmentin and prn auralgan. Pt will call if symptoms are not improving over next 72hr. She will stay home from work for next 2 days.

## 2012-02-01 ENCOUNTER — Encounter: Payer: Self-pay | Admitting: Internal Medicine

## 2012-02-01 MED ORDER — FLUCONAZOLE 150 MG PO TABS: 150.0000 mg | ORAL_TABLET | Freq: Once | ORAL | Status: DC

## 2012-03-23 ENCOUNTER — Encounter: Payer: Self-pay | Admitting: Internal Medicine

## 2012-03-23 ENCOUNTER — Ambulatory Visit (INDEPENDENT_AMBULATORY_CARE_PROVIDER_SITE_OTHER): Payer: PRIVATE HEALTH INSURANCE | Admitting: Internal Medicine

## 2012-03-23 VITALS — BP 122/82 | HR 90 | Temp 98.0°F | Ht 70.0 in | Wt 178.8 lb

## 2012-03-23 DIAGNOSIS — Z0000 Encounter for general adult medical examination without abnormal findings: Secondary | ICD-10-CM

## 2012-03-23 DIAGNOSIS — E039 Hypothyroidism, unspecified: Secondary | ICD-10-CM

## 2012-03-23 DIAGNOSIS — IMO0001 Reserved for inherently not codable concepts without codable children: Secondary | ICD-10-CM

## 2012-03-23 DIAGNOSIS — E663 Overweight: Secondary | ICD-10-CM

## 2012-03-23 DIAGNOSIS — Z309 Encounter for contraceptive management, unspecified: Secondary | ICD-10-CM

## 2012-03-23 MED ORDER — LEVOTHYROXINE SODIUM 75 MCG PO TABS: 75.0000 ug | ORAL_TABLET | Freq: Every day | ORAL | Status: DC

## 2012-03-23 MED ORDER — LEVONORGESTREL-ETHINYL ESTRAD 0.1-20 MG-MCG PO TABS: 1.0000 | ORAL_TABLET | Freq: Every day | ORAL | Status: DC

## 2012-03-23 NOTE — Progress Notes (Signed)
  Subjective:    Patient ID: Kristi [name], female    DOB: [birth year], 26 y.o.   MRN: [id number]  HPI 26 year old female with history of hypothyroidism presents for followup. She reports she has been doing well. She is no longer been taking phentermine to help with appetite suppression that she has been able to maintain her weight with diet and regular physical activity. In regards to hypothyroidism, she denies any side effects from her medication such as palpitations, temperature intolerance, constipation. She has no new concerns today.  Outpatient Encounter Prescriptions as of 03/23/2012  Medication Sig Dispense Refill  . levonorgestrel-ethinyl estradiol (AVIANE,ALESSE,LESSINA) 0.1-20 MG-MCG tablet Take 1 tablet by mouth daily.  3 Package  4  . levothyroxine (SYNTHROID, LEVOTHROID) 75 MCG tablet Take 1 tablet (75 mcg total) by mouth daily.  90 tablet  4   BP 122/82  Pulse 90  Temp 98 F (36.7 C) (Oral)  Ht 5\' 10"  (1.778 m)  Wt 178 lb 12 oz (81.08 kg)  BMI 25.65 kg/m2  SpO2 97%  LMP 03/09/2012  Review of Systems  Constitutional: Negative for fever, chills, appetite change, fatigue and unexpected weight change.  HENT: Negative for ear pain, congestion, sore throat, trouble swallowing, neck pain, voice change and sinus pressure.   Eyes: Negative for visual disturbance.  Respiratory: Negative for cough, shortness of breath, wheezing and stridor.   Cardiovascular: Negative for chest pain, palpitations and leg swelling.  Gastrointestinal: Negative for nausea, vomiting, abdominal pain, diarrhea, constipation, blood in stool, abdominal distention and anal bleeding.  Genitourinary: Negative for dysuria and flank pain.  Musculoskeletal: Negative for myalgias, arthralgias and gait problem.  Skin: Negative for color change and rash.  Neurological: Negative for dizziness and headaches.  Hematological: Negative for adenopathy. Does not bruise/bleed easily.  Psychiatric/Behavioral: Negative  for suicidal ideas, sleep disturbance and dysphoric mood. The patient is not nervous/anxious.        Objective:   Physical Exam  Constitutional: She is oriented to person, place, and time. She appears well-developed and well-nourished. No distress.  HENT:  Head: Normocephalic and atraumatic.  Right Ear: External ear normal.  Left Ear: External ear normal.  Nose: Nose normal.  Mouth/Throat: Oropharynx is clear and moist. No oropharyngeal exudate.  Eyes: Conjunctivae normal are normal. Pupils are equal, round, and reactive to light. Right eye exhibits no discharge. Left eye exhibits no discharge. No scleral icterus.  Neck: Normal range of motion. Neck supple. No tracheal deviation present. No thyromegaly present.  Cardiovascular: Normal rate, regular rhythm, normal heart sounds and intact distal pulses.  Exam reveals no gallop and no friction rub.   No murmur heard. Pulmonary/Chest: Effort normal and breath sounds normal. No respiratory distress. She has no wheezes. She has no rales. She exhibits no tenderness.  Musculoskeletal: Normal range of motion. She exhibits no edema and no tenderness.  Lymphadenopathy:    She has no cervical adenopathy.  Neurological: She is alert and oriented to person, place, and time. No cranial nerve deficit. She exhibits normal muscle tone. Coordination normal.  Skin: Skin is warm and dry. No rash noted. She is not diaphoretic. No erythema. No pallor.  Psychiatric: She has a normal mood and affect. Her behavior is normal. Judgment and thought content normal.          Assessment & Plan:

## 2012-03-23 NOTE — Assessment & Plan Note (Signed)
Symptomatically doing well. Will plan to recheck TSH with labs in 07/2012.  Continue synthroid.

## 2012-03-23 NOTE — Assessment & Plan Note (Signed)
Weight is stable on phentermine. Encouraged her to continue efforts at healthy diet and regular physical activity. Followup for physical exam in March of 2014.

## 2012-07-20 ENCOUNTER — Other Ambulatory Visit (INDEPENDENT_AMBULATORY_CARE_PROVIDER_SITE_OTHER): Payer: 59

## 2012-07-20 DIAGNOSIS — Z0000 Encounter for general adult medical examination without abnormal findings: Secondary | ICD-10-CM

## 2012-07-20 LAB — CBC WITH DIFFERENTIAL/PLATELET
Basophils Relative: 0.5 % (ref 0.0–3.0)
Eosinophils Relative: 0.9 % (ref 0.0–5.0)
HCT: 37.9 % (ref 36.0–46.0)
Lymphs Abs: 1.5 10*3/uL (ref 0.7–4.0)
Monocytes Relative: 5.9 % (ref 3.0–12.0)
Neutrophils Relative %: 65.4 % (ref 43.0–77.0)
Platelets: 285 10*3/uL (ref 150.0–400.0)
RBC: 4.4 Mil/uL (ref 3.87–5.11)
WBC: 5.3 10*3/uL (ref 4.5–10.5)

## 2012-07-20 LAB — COMPREHENSIVE METABOLIC PANEL
BUN: 10 mg/dL (ref 6–23)
CO2: 27 mEq/L (ref 19–32)
Creatinine, Ser: 0.7 mg/dL (ref 0.4–1.2)
GFR: 110.2 mL/min (ref >60.00)
Glucose, Bld: 91 mg/dL (ref 70–99)
Sodium: 137 mEq/L (ref 135–145)
Total Bilirubin: 0.5 mg/dL (ref 0.3–1.2)
Total Protein: 7.5 g/dL (ref 6.0–8.3)

## 2012-07-20 LAB — TSH: TSH: 3.86 u[IU]/mL (ref 0.35–5.50)

## 2012-07-20 LAB — LDL CHOLESTEROL, DIRECT: Direct LDL: 138.2 mg/dL

## 2012-07-20 LAB — LIPID PANEL
Cholesterol: 211 mg/dL — ABNORMAL HIGH (ref 0–200)
HDL: 40.1 mg/dL (ref >39.00)
Triglycerides: 152 mg/dL — ABNORMAL HIGH (ref 0.0–149.0)

## 2012-07-21 ENCOUNTER — Encounter: Payer: Self-pay | Admitting: Internal Medicine

## 2012-07-21 ENCOUNTER — Ambulatory Visit (INDEPENDENT_AMBULATORY_CARE_PROVIDER_SITE_OTHER): Payer: 59 | Admitting: Internal Medicine

## 2012-07-23 NOTE — Progress Notes (Signed)
  Subjective:    Patient ID: Kristi [name], female    DOB: December 29, 1985, 27 y.o.   MRN: [id number]  HPI  No show  Review of Systems     Objective:   Physical Exam        Assessment & Plan:

## 2012-09-25 ENCOUNTER — Encounter: Payer: Self-pay | Admitting: Internal Medicine

## 2013-03-15 ENCOUNTER — Ambulatory Visit (INDEPENDENT_AMBULATORY_CARE_PROVIDER_SITE_OTHER): Payer: 59 | Admitting: Internal Medicine

## 2013-03-15 ENCOUNTER — Encounter: Payer: Self-pay | Admitting: Internal Medicine

## 2013-03-15 ENCOUNTER — Other Ambulatory Visit: Payer: Self-pay

## 2013-03-15 VITALS — BP 110/80 | HR 78 | Temp 98.1°F | Ht 70.0 in | Wt 213.5 lb

## 2013-03-15 DIAGNOSIS — E663 Overweight: Secondary | ICD-10-CM

## 2013-03-15 DIAGNOSIS — Z3490 Encounter for supervision of normal pregnancy, unspecified, unspecified trimester: Secondary | ICD-10-CM | POA: Insufficient documentation

## 2013-03-15 DIAGNOSIS — E039 Hypothyroidism, unspecified: Secondary | ICD-10-CM

## 2013-03-15 DIAGNOSIS — Z3480 Encounter for supervision of other normal pregnancy, unspecified trimester: Secondary | ICD-10-CM

## 2013-03-15 MED ORDER — LEVOTHYROXINE SODIUM 75 MCG PO TABS: 75.0000 ug | ORAL_TABLET | Freq: Every day | ORAL | Status: DC

## 2013-03-15 NOTE — Progress Notes (Signed)
  Subjective:    Patient ID: Kristi [name], female    DOB: [birth year], 27 y.o.   MRN: [id number]  HPI 27YO female with h/o hypothyroidism presents for follow up. Feeling well. Compliant with medication. Planning to try to become pregnant. Stopped OCP last month. Trying to work on improving diet and getting regular exercise. No new concerns today.  Outpatient Encounter Prescriptions as of 03/15/2013  Medication Sig  . levothyroxine (SYNTHROID, LEVOTHROID) 75 MCG tablet Take 1 tablet (75 mcg total) by mouth daily.   BP 110/80  Pulse 78  Temp(Src) 98.1 F (36.7 C) (Oral)  Ht 5\' 10"  (1.778 m)  Wt 213 lb 8 oz (96.843 kg)  BMI 30.63 kg/m2  SpO2 98%  LMP 02/26/2013  Review of Systems  Constitutional: Negative for fever, chills, appetite change, fatigue and unexpected weight change.  HENT: Negative for congestion, ear pain, sinus pressure, sore throat, trouble swallowing and voice change.   Eyes: Negative for visual disturbance.  Respiratory: Negative for cough, shortness of breath, wheezing and stridor.   Cardiovascular: Negative for chest pain, palpitations and leg swelling.  Gastrointestinal: Negative for nausea, vomiting, abdominal pain, diarrhea, constipation, blood in stool, abdominal distention and anal bleeding.  Genitourinary: Negative for dysuria and flank pain.  Musculoskeletal: Negative for arthralgias, gait problem, myalgias and neck pain.  Skin: Negative for color change and rash.  Neurological: Negative for dizziness and headaches.  Hematological: Negative for adenopathy. Does not bruise/bleed easily.  Psychiatric/Behavioral: Negative for suicidal ideas, sleep disturbance and dysphoric mood. The patient is not nervous/anxious.        Objective:   Physical Exam  Constitutional: She is oriented to person, place, and time. She appears well-developed and well-nourished. No distress.  HENT:  Head: Normocephalic and atraumatic.  Right Ear: External ear normal.  Left Ear:  External ear normal.  Nose: Nose normal.  Mouth/Throat: Oropharynx is clear and moist. No oropharyngeal exudate.  Eyes: Conjunctivae are normal. Pupils are equal, round, and reactive to light. Right eye exhibits no discharge. Left eye exhibits no discharge. No scleral icterus.  Neck: Normal range of motion. Neck supple. No tracheal deviation present. No thyromegaly present.  Cardiovascular: Normal rate, regular rhythm, normal heart sounds and intact distal pulses.  Exam reveals no gallop and no friction rub.   No murmur heard. Pulmonary/Chest: Effort normal and breath sounds normal. No accessory muscle usage. Not tachypneic. No respiratory distress. She has no decreased breath sounds. She has no wheezes. She has no rhonchi. She has no rales. She exhibits no tenderness.  Musculoskeletal: Normal range of motion. She exhibits no edema and no tenderness.  Lymphadenopathy:    She has no cervical adenopathy.  Neurological: She is alert and oriented to person, place, and time. No cranial nerve deficit. She exhibits normal muscle tone. Coordination normal.  Skin: Skin is warm and dry. No rash noted. She is not diaphoretic. No erythema. No pallor.  Psychiatric: She has a normal mood and affect. Her behavior is normal. Judgment and thought content normal.          Assessment & Plan:

## 2013-03-15 NOTE — Assessment & Plan Note (Addendum)
Pt trying to become pregnant. Discussed prenatal care. She is taking a PNV. Discussed foods to avoid including unpasturized cheese, processed meats. We discussed importance of healthy diet and regular exercise. Discussed importance of avoiding some OTC meds including ibuprofen. She will establish care at [facility].

## 2013-03-15 NOTE — Assessment & Plan Note (Signed)
Wt Readings from Last 3 Encounters:  03/15/13 213 lb 8 oz (96.843 kg)  03/23/12 178 lb 12 oz (81.08 kg)  01/19/12 179 lb 8 oz (81.421 kg)   Body mass index is 30.63 kg/(m^2). Encouraged healthy diet and regular physical activity with goal of 3x per week.

## 2013-03-15 NOTE — Assessment & Plan Note (Signed)
Recent TSH was normal. Will continue Levothyroxine. Encouraged pt to establish care with OB prior to becoming pregnant. Follow up 1 year and prn.

## 2013-03-15 NOTE — Progress Notes (Signed)
Pre-visit discussion using our clinic review tool. No additional management support is needed unless otherwise documented below in the visit note.  

## 2013-05-08 ENCOUNTER — Other Ambulatory Visit: Payer: Self-pay

## 2013-05-08 LAB — CBC WITH DIFFERENTIAL/PLATELET
Eosinophil %: 0.7 %
HGB: 12.8 g/dL (ref 12.0–16.0)
Lymphocyte %: 22.8 %
MCH: 29.3 pg (ref 26.0–34.0)
MCHC: 34 g/dL (ref 32.0–36.0)
Monocyte #: 0.5 x10 3/mm (ref 0.2–0.9)
Monocyte %: 7.4 %
RBC: 4.36 10*6/uL (ref 3.80–5.20)
WBC: 7.2 10*3/uL (ref 3.6–11.0)

## 2013-05-08 LAB — URINALYSIS, COMPLETE
Bilirubin,UR: NEGATIVE
Blood: NEGATIVE
Nitrite: NEGATIVE
Ph: 6 (ref 4.5–8.0)
RBC,UR: 3 /HPF (ref 0–5)
Specific Gravity: 1.015 (ref 1.003–1.030)
Squamous Epithelial: 3

## 2013-05-10 HISTORY — PX: DILATION AND CURETTAGE OF UTERUS: SHX78

## 2013-05-11 ENCOUNTER — Other Ambulatory Visit: Payer: Self-pay

## 2013-05-11 LAB — HCG, QUANTITATIVE, PREGNANCY: BETA HCG, QUANT.: 3110 m[IU]/mL — AB

## 2013-06-13 ENCOUNTER — Ambulatory Visit: Payer: Self-pay | Admitting: Obstetrics and Gynecology

## 2013-06-13 LAB — HEMOGLOBIN: HGB: 12.8 g/dL (ref 12.0–16.0)

## 2013-06-19 LAB — PATHOLOGY REPORT

## 2013-08-15 ENCOUNTER — Other Ambulatory Visit: Payer: Self-pay | Admitting: Obstetrics and Gynecology

## 2013-08-15 LAB — TSH: THYROID STIMULATING HORM: 3.51 u[IU]/mL

## 2014-03-18 ENCOUNTER — Encounter: Payer: 59 | Admitting: Internal Medicine

## 2014-05-12 ENCOUNTER — Observation Stay: Payer: Self-pay | Admitting: Obstetrics and Gynecology

## 2014-05-13 ENCOUNTER — Inpatient Hospital Stay: Payer: Self-pay | Admitting: Obstetrics and Gynecology

## 2014-05-13 LAB — PIH PROFILE
ANION GAP: 9 (ref 7–16)
BUN: 6 mg/dL — AB (ref 7–18)
CHLORIDE: 107 mmol/L (ref 98–107)
CO2: 24 mmol/L (ref 21–32)
Calcium, Total: 8.6 mg/dL (ref 8.5–10.1)
Creatinine: 0.54 mg/dL — ABNORMAL LOW (ref 0.60–1.30)
EGFR (African American): >60
EGFR (Non-African Amer.): >60
Glucose: 74 mg/dL (ref 65–99)
HCT: 36.2 % (ref 35.0–47.0)
HGB: 11.7 g/dL — AB (ref 12.0–16.0)
MCH: 28.6 pg (ref 26.0–34.0)
MCHC: 32.3 g/dL (ref 32.0–36.0)
MCV: 89 fL (ref 80–100)
OSMOLALITY: 276 (ref 275–301)
PLATELETS: 184 10*3/uL (ref 150–440)
Potassium: 3.7 mmol/L (ref 3.5–5.1)
RBC: 4.08 10*6/uL (ref 3.80–5.20)
RDW: 15 % — ABNORMAL HIGH (ref 11.5–14.5)
SGOT(AST): 21 U/L (ref 15–37)
Sodium: 140 mmol/L (ref 136–145)
URIC ACID: 5.5 mg/dL (ref 2.6–6.0)
WBC: 9.4 10*3/uL (ref 3.6–11.0)

## 2014-05-13 LAB — PROTEIN / CREATININE RATIO, URINE
Creatinine, Urine: 83.2 mg/dL (ref 30.0–125.0)
PROTEIN, RANDOM URINE: 28 mg/dL — AB (ref 0–12)
Protein/Creat. Ratio: 337 mg/gCREAT — ABNORMAL HIGH (ref 0–200)

## 2014-05-14 LAB — PLATELET COUNT: PLATELETS: 166 10*3/uL (ref 150–440)

## 2014-05-16 LAB — HEMATOCRIT: HCT: 31.1 % — ABNORMAL LOW (ref 35.0–47.0)

## 2014-08-31 NOTE — Op Note (Signed)
PATIENT NAME:  Kristi [name], Kristi [name] MR#:  [id number] DATE OF BIRTH:  Jan 25, 1986  DATE OF PROCEDURE:  06/13/2013  PREOPERATIVE DIAGNOSIS:  Missed abortion at [redacted] weeks gestation.   POSTOPERATIVE DIAGNOSIS: Missed abortion at [redacted] weeks gestation.   OPERATIONS: Include suction D and C.   ANESTHESIA: General endotracheal.   SURGEON: Hildred LaserAnika Zylon Creamer, MD   EBL: 25 mL.   OPERATIVE FLUIDS: 1200 mL.   URINE OUTPUT 100 mL.   COMPLICATIONS. There were none.   FINDINGS: Included uterus sounding to approximately 9 cm, products of conception visualized during suction.   SPECIMEN: Included products of conception.   PROCEDURE: The patient was taken to the Operating Room where she was placed under general anesthesia without difficulty. She was then prepped and draped in normal sterile fashion in the dorsal lithotomy position. Next, [name] weighted speculum was placed into the vagina and [name]  vaginal retractor was used to identify the cervix. The anterior portion of the cervix was then identified, grasped using [name] single-tooth tenaculum. Next, the uterus was sounded. Sound was noted to be approximately 9 cm. After this, the uterus was dilated appropriately until an 8 cm cannula was able to the placed without difficulty. After this, the suction device was then activated and [name] suction curettage was performed with products of conception noted upon withdrawal of the cannula. Several passes were made until no more products of conception were visualized in the return from the cannula. After this, the suction cannula was removed from the patient's uterus and [name] sharp curettage was performed. Next, the suction curette was then reintroduced into the patient's uterine cavity and [name] final pass was made ensuring clearance of all clots and tissue products.   The suction curette was then removed from the uterine cavity. The tenaculum was then removed from the patient's anterior portion of the cervix. All retractors and the speculum were  then removed from the patient's vagina after hemostasis was noted at the tenaculum site. The patient tolerated the procedure. She was taken to the recovery room in stable condition.   [name] small portion of the specimen of products of conception were removed from the Vacutainer and placed into [name] separate specimen container as patient desired Anora miscarriage testing of the POC, testing performed by [facility]. This will be submitted as [name] separate specimen for genetic testing.   The patient will be discharged home same day. She will be given [name] prescription for doxycycline 100 mg b.i.d. for 7 days also, for Methergine 0.2 mg t.i.d. x 24 hours and ibuprofen 800 mg p.o. t.i.d. p.r.n. for pain as needed.   ____________________________ Jacques EarthlyAnika S. Valentino Saxonherry, MD asc:cs D: 06/13/2013 13:43:53 ET T: 06/13/2013 14:06:05 ET JOB#: [id number]  cc: Jacques EarthlyAnika S. Valentino Saxonherry, MD, <Dictator> Fabian NovemberANIKA S Lyle Leisner MD ELECTRONICALLY SIGNED 06/15/2013 10:16

## 2014-09-03 DIAGNOSIS — E559 Vitamin D deficiency, unspecified: Secondary | ICD-10-CM

## 2014-09-17 NOTE — H&P (Signed)
L&D Evaluation:  History:  HPI 28yo MWF presents at [redacted]w[redacted]d EGA with c/o severe HA all day, unrelieved by tylenol, and getting worse, accompanied by worsening pedal edema and epigastric pressure. Pregnancy complicated by hypothyriodism and obesity. G2P0010.   Presents with HA, edema   Patient's Medical History Thyroid Disease   Patient's Surgical History D&C   Medications Pre Natal Vitamins  Tylenol (Acetaminophen)  Levothyroxine 112mcg   Allergies NKDA   Social History none   Family History Non-Contributory   ROS:  ROS All systems were reviewed.  HEENT, CNS, GI, GU, Respiratory, CV, Renal and Musculoskeletal systems were found to be normal.   Exam:  Vital Signs other  130s/80-90s   Urine Protein trace   General no apparent distress   Mental Status clear   Chest clear   Heart normal sinus rhythm   Abdomen gravid, non-tender   Estimated Fetal Weight Average for gestational age   Fetal Position vtx   Edema 1+   Reflexes dimished   Clonus negative   FHT normal rate with no decels   Fetal Heart Rate 121   Ucx irregular   Impression:  Impression evaluation for PIH   Plan:  Plan monitor BP, PIH panel   Electronic Signatures: Ulyses AmorBurr, Melody N (CNM)  (Signed 04-Jan-16 17:34)  Authored: L&D Evaluation   Last Updated: 04-Jan-16 17:34 by Ulyses AmorBurr, Melody N (CNM)

## 2014-10-25 ENCOUNTER — Ambulatory Visit (INDEPENDENT_AMBULATORY_CARE_PROVIDER_SITE_OTHER): Payer: 59 | Admitting: Obstetrics and Gynecology

## 2014-10-25 ENCOUNTER — Encounter: Payer: Self-pay | Admitting: Obstetrics and Gynecology

## 2014-10-25 VITALS — BP 115/85 | HR 81 | Ht 70.0 in | Wt 225.4 lb

## 2014-10-25 DIAGNOSIS — E669 Obesity, unspecified: Secondary | ICD-10-CM

## 2014-10-25 DIAGNOSIS — E559 Vitamin D deficiency, unspecified: Secondary | ICD-10-CM | POA: Diagnosis not present

## 2014-10-25 MED ORDER — PHENTERMINE HCL 37.5 MG PO TABS: 37.5000 mg | ORAL_TABLET | Freq: Every day | ORAL | Status: DC

## 2014-10-25 MED ORDER — CYANOCOBALAMIN 1000 MCG/ML IJ SOLN: 1000.0000 ug | Freq: Once | INTRAMUSCULAR | Status: DC

## 2014-10-25 NOTE — Progress Notes (Signed)
Patient ID: Kristi [name], female   DOB: Apr 20, 1986, 29 y.o.   MRN: [id number] Name: Kristi [name]   MRN: [id number]    DOB: 02-18-1986   Date:10/25/2014       Progress Note  Subjective  Chief Complaint  Chief Complaint  Patient presents with  . weight management    HPI  Has been off Adipex x 1 week, desires restart to continue weight loss, feels good and happy about results so far.  No problem-specific assessment & plan notes found for this encounter.   Past Medical History  Diagnosis Date  . Pap smear, low-risk 2011    Past Surgical History  Procedure Laterality Date  . Upper gastrointestinal endoscopy  2010    Kristi [name]    Family History  Problem Relation Age of Onset  . Hypothyroidism Mother   . Hypothyroidism Maternal Grandmother   . Heart disease Paternal Grandfather     History   Social History  . Marital Status: Married    Spouse Name: N/A  . Number of Children: N/A  . Years of Education: N/A   Occupational History  . Not on file.   Social History Main Topics  . Smoking status: Never Smoker   . Smokeless tobacco: Never Used  . Alcohol Use: No  . Drug Use: No  . Sexual Activity: Yes    Birth Control/ Protection: Pill   Other Topics Concern  . Not on file   Social History Narrative   [facility] imaging.     Current outpatient prescriptions:  .  phentermine (ADIPEX-P) 37.5 MG tablet, Take 1 tablet (37.5 mg total) by mouth daily before breakfast., Disp: 30 tablet, Rfl: 2 .  vitamin B-12 (CYANOCOBALAMIN) 1000 MCG tablet, Inject 1,000 mcg into the muscle every 30 (thirty) days., Disp: , Rfl:  .  levothyroxine (SYNTHROID, LEVOTHROID) 75 MCG tablet, Take 1 tablet (75 mcg total) by mouth daily., Disp: 90 tablet, Rfl: 4  No Known Allergies   ROS  All systems negative  Objective  Filed Vitals:   10/25/14 1440  BP: 115/85  Pulse: 81  Height: 5\' 10"  (1.778 m)  Weight: 225 lb 6.4 oz (102.241 kg)    Physical Exam   A&O X 4   No  results found for this or any previous visit (from the past 2160 hour(s)).   Assessment & Plan  Problem List Items Addressed This Visit    None    Visit Diagnoses    Vitamin D deficiency    -  Primary    Relevant Orders    Vitamin D (25 hydroxy)    Thyroid Panel With TSH       Meds ordered this encounter  Medications  . vitamin B-12 (CYANOCOBALAMIN) 1000 MCG tablet    Sig: Inject 1,000 mcg into the muscle every 30 (thirty) days.  . phentermine (ADIPEX-P) 37.5 MG tablet    Sig: Take 1 tablet (37.5 mg total) by mouth daily before breakfast.    Dispense:  30 tablet    Refill:  2   Hypothyriodism- labs repeated to verify stable Contraception- happy with current combined OCP, but sometimes takes a pill late- offered Nuvaring- will consider  Obesity- to wait 1 more week and then restart Adipex- recheck in 5 weeks or prn.   Melody Elissa Lovett, CNM

## 2014-10-25 NOTE — Addendum Note (Signed)
Addended by: Rosine Beat L on: 10/25/2014 03:19 PM   Modules accepted: Orders

## 2014-10-26 LAB — THYROID PANEL WITH TSH
Free Thyroxine Index: 2.2 (ref 1.2–4.9)
T3 Uptake Ratio: 24 % (ref 24–39)
T4 TOTAL: 9 ug/dL (ref 4.5–12.0)
TSH: 2 u[IU]/mL (ref 0.450–4.500)

## 2014-10-26 LAB — VITAMIN D 25 HYDROXY (VIT D DEFICIENCY, FRACTURES): Vit D, 25-Hydroxy: 33.1 ng/mL (ref 30.0–100.0)

## 2014-11-25 ENCOUNTER — Other Ambulatory Visit: Payer: Self-pay | Admitting: Obstetrics and Gynecology

## 2014-11-25 DIAGNOSIS — E038 Other specified hypothyroidism: Secondary | ICD-10-CM

## 2014-11-27 MED ORDER — LEVOTHYROXINE SODIUM 75 MCG PO TABS: 75.0000 ug | ORAL_TABLET | Freq: Every day | ORAL | Status: DC

## 2014-11-29 ENCOUNTER — Ambulatory Visit (INDEPENDENT_AMBULATORY_CARE_PROVIDER_SITE_OTHER): Payer: 59 | Admitting: Obstetrics and Gynecology

## 2014-11-29 VITALS — BP 118/78 | HR 88 | Ht 70.0 in | Wt 224.0 lb

## 2014-11-29 DIAGNOSIS — E669 Obesity, unspecified: Secondary | ICD-10-CM

## 2014-11-29 DIAGNOSIS — E663 Overweight: Secondary | ICD-10-CM | POA: Diagnosis not present

## 2014-11-29 MED ORDER — CYANOCOBALAMIN 1000 MCG/ML IJ SOLN
1000.0000 ug | Freq: Once | INTRAMUSCULAR | Status: AC
  Administered 2014-11-29: 1000 ug via INTRAMUSCULAR

## 2014-11-29 NOTE — Progress Notes (Cosign Needed)
Pt is here for wt, bp check and b-12 inj States she has done as well this month due to grandmother passing and she is planning on getting back on track States some evenings the medication can make her agitated, advised to only try 1/2 tablet and see if that helps

## 2014-12-27 ENCOUNTER — Ambulatory Visit (INDEPENDENT_AMBULATORY_CARE_PROVIDER_SITE_OTHER): Payer: 59 | Admitting: Obstetrics and Gynecology

## 2014-12-27 VITALS — BP 122/85 | HR 81 | Ht 70.0 in | Wt 218.2 lb

## 2014-12-27 DIAGNOSIS — E669 Obesity, unspecified: Secondary | ICD-10-CM | POA: Diagnosis not present

## 2014-12-27 MED ORDER — CYANOCOBALAMIN 1000 MCG/ML IJ SOLN
1000.0000 ug | Freq: Once | INTRAMUSCULAR | Status: AC
  Administered 2014-12-27: 1000 ug via INTRAMUSCULAR

## 2014-12-27 NOTE — Progress Notes (Cosign Needed)
Patient ID: Kristi [name], female   DOB: [birth year], 29 y.o.   MRN: [id number] Weight loss of 6 lbs since last visit. Doing well. No problems with medication.

## 2016-01-19 ENCOUNTER — Other Ambulatory Visit: Payer: Self-pay | Admitting: *Deleted

## 2016-01-19 DIAGNOSIS — E038 Other specified hypothyroidism: Secondary | ICD-10-CM

## 2016-01-19 MED ORDER — LEVOTHYROXINE SODIUM 75 MCG PO TABS: 75.0000 ug | ORAL_TABLET | Freq: Every day | ORAL | 4 refills | Status: DC

## 2016-01-19 MED ORDER — LEVOTHYROXINE SODIUM 75 MCG PO TABS: 75.0000 ug | ORAL_TABLET | Freq: Every day | ORAL | 0 refills | Status: DC

## 2016-02-17 ENCOUNTER — Encounter: Payer: Self-pay | Admitting: *Deleted

## 2016-02-19 ENCOUNTER — Ambulatory Visit (INDEPENDENT_AMBULATORY_CARE_PROVIDER_SITE_OTHER): Payer: BLUE CROSS/BLUE SHIELD | Admitting: Obstetrics and Gynecology

## 2016-02-19 ENCOUNTER — Encounter: Payer: Self-pay | Admitting: Obstetrics and Gynecology

## 2016-02-19 VITALS — BP 113/79 | HR 67 | Ht 70.0 in | Wt 231.2 lb

## 2016-02-19 DIAGNOSIS — Z01419 Encounter for gynecological examination (general) (routine) without abnormal findings: Secondary | ICD-10-CM | POA: Diagnosis not present

## 2016-02-19 DIAGNOSIS — N930 Postcoital and contact bleeding: Secondary | ICD-10-CM | POA: Diagnosis not present

## 2016-02-19 DIAGNOSIS — N941 Unspecified dyspareunia: Secondary | ICD-10-CM

## 2016-02-19 DIAGNOSIS — N644 Mastodynia: Secondary | ICD-10-CM

## 2016-02-19 DIAGNOSIS — E6609 Other obesity due to excess calories: Secondary | ICD-10-CM

## 2016-02-19 DIAGNOSIS — N946 Dysmenorrhea, unspecified: Secondary | ICD-10-CM

## 2016-02-19 DIAGNOSIS — E559 Vitamin D deficiency, unspecified: Secondary | ICD-10-CM

## 2016-02-19 DIAGNOSIS — N921 Excessive and frequent menstruation with irregular cycle: Secondary | ICD-10-CM | POA: Diagnosis not present

## 2016-02-19 DIAGNOSIS — Z6833 Body mass index (BMI) 33.0-33.9, adult: Secondary | ICD-10-CM | POA: Diagnosis not present

## 2016-02-19 MED ORDER — ETONOGESTREL-ETHINYL ESTRADIOL 0.12-0.015 MG/24HR VA RING: VAGINAL_RING | VAGINAL | 12 refills | Status: DC

## 2016-02-19 NOTE — Progress Notes (Signed)
   Subjective:     Kristi [name] is a 30 y.o. female and is here for a comprehensive physical exam. The patient reports problems - left breast pain and dyspareunia. Stopped taking birth control in June and reports heavier, more painful periods, but dyspareunia did not resolve. Describes dyspareunia "deep inside," persistent, and regardless of position. Reports bleeding after sex that varies in amount. Describes breast pain as sharpe that lasts several minutes, occurs 1-2 times per week, no alleviating or aggravating factors. Works at [employer], married, [id number]. Does not exercise regularly but trying to loose weight through [employer].  Social History   Social History  . Marital status: Married    Spouse name: N/A  . Number of children: N/A  . Years of education: N/A   Occupational History  . Not on file.   Social History Main Topics  . Smoking status: Never Smoker  . Smokeless tobacco: Never Used  . Alcohol use No  . Drug use: No  . Sexual activity: Yes    Birth control/ protection: None   Other Topics Concern  . Not on file   Social History Narrative   [facility].   Health Maintenance  Topic Date Due  . HIV Screening  07/28/2002  . PAP SMEAR  12/19/2012  . INFLUENZA VACCINE  12/09/2015  . TETANUS/TDAP  01/20/2021    The following portions of the patient's history were reviewed and updated as appropriate: allergies, current medications, past family history, past medical history, past social history, past surgical history and problem list.  Review of Systems Pertinent items noted in HPI and remainder of comprehensive ROS otherwise negative.   Objective:    General appearance: alert, cooperative and appears stated age Neck: no adenopathy, no carotid bruit, no JVD, supple, symmetrical, trachea midline and thyroid not enlarged, symmetric, no tenderness/mass/nodules Lungs: clear to auscultation bilaterally Heart: regular rate and rhythm, S1, S2 normal, no murmur,  click, rub or gallop Abdomen: soft, non-tender; bowel sounds normal; no masses,  no organomegaly Pelvic: cervix normal in appearance, external genitalia normal, no adnexal masses or tenderness, no cervical motion tenderness, rectovaginal septum normal, uterus normal size, shape, and consistency and vagina normal without discharge    Assessment:    Healthy female exam. Left breast mastadynia, Dyspareunia, obesity, postcoital spotting, and Birth Control Management, hypothyroidism     Plan:  Labs drawn today Flu vaccine given per request  Will start Nuvaring for birth control and consider pelvic ultrasound if dyspareunia gets worse or is persistent. Counseled on common causes of breast pain including hormonal changes and caffeine intake.  RTC 1 year or as needed.  Melody Aura CampsShambley, CNM  See After Visit Summary for Counseling Recommendations

## 2016-02-19 NOTE — Patient Instructions (Signed)
Preventive Care for Adults, Female A healthy lifestyle and preventive care can promote health and wellness. Preventive health guidelines for women include the following key practices.  A routine yearly physical is a good way to check with your health care provider about your health and preventive screening. It is a chance to share any concerns and updates on your health and to receive a thorough exam.  Visit your dentist for a routine exam and preventive care every 6 months. Brush your teeth twice a day and floss once a day. Good oral hygiene prevents tooth decay and gum disease.  The frequency of eye exams is based on your age, health, family medical history, use of contact lenses, and other factors. Follow your health care provider's recommendations for frequency of eye exams.  Eat a healthy diet. Foods like vegetables, fruits, whole grains, low-fat dairy products, and lean protein foods contain the nutrients you need without too many calories. Decrease your intake of foods high in solid fats, added sugars, and salt. Eat the right amount of calories for you.Get information about a proper diet from your health care provider, if necessary.  Regular physical exercise is one of the most important things you can do for your health. Most adults should get at least 150 minutes of moderate-intensity exercise (any activity that increases your heart rate and causes you to sweat) each week. In addition, most adults need muscle-strengthening exercises on 2 or more days a week.  Maintain a healthy weight. The body mass index (BMI) is a screening tool to identify possible weight problems. It provides an estimate of body fat based on height and weight. Your health care provider can find your BMI and can help you achieve or maintain a healthy weight.For adults 20 years and older:  A BMI below 18.5 is considered underweight.  A BMI of 18.5 to 24.9 is normal.  A BMI of 25 to 29.9 is considered  overweight.  A BMI of 30 and above is considered obese.  Maintain normal blood lipids and cholesterol levels by exercising and minimizing your intake of saturated fat. Eat a balanced diet with plenty of fruit and vegetables. Blood tests for lipids and cholesterol should begin at age 64 and be repeated every 5 years. If your lipid or cholesterol levels are high, you are over 50, or you are at high risk for heart disease, you may need your cholesterol levels checked more frequently.Ongoing high lipid and cholesterol levels should be treated with medicines if diet and exercise are not working.  If you smoke, find out from your health care provider how to quit. If you do not use tobacco, do not start.  Lung cancer screening is recommended for adults aged 52-80 years who are at high risk for developing lung cancer because of a history of smoking. A yearly low-dose CT scan of the lungs is recommended for people who have at least a 30-pack-year history of smoking and are a current smoker or have quit within the past 15 years. A pack year of smoking is smoking an average of 1 pack of cigarettes a day for 1 year (for example: 1 pack a day for 30 years or 2 packs a day for 15 years). Yearly screening should continue until the smoker has stopped smoking for at least 15 years. Yearly screening should be stopped for people who develop a health problem that would prevent them from having lung cancer treatment.  If you are pregnant, do not drink alcohol. If you are  breastfeeding, be very cautious about drinking alcohol. If you are not pregnant and choose to drink alcohol, do not have more than 1 drink per day. One drink is considered to be 12 ounces (355 mL) of beer, 5 ounces (148 mL) of wine, or 1.5 ounces (44 mL) of liquor.  Avoid use of street drugs. Do not share needles with anyone. Ask for help if you need support or instructions about stopping the use of drugs.  High blood pressure causes heart disease and  increases the risk of stroke. Your blood pressure should be checked at least every 1 to 2 years. Ongoing high blood pressure should be treated with medicines if weight loss and exercise do not work.  If you are 25-78 years old, ask your health care provider if you should take aspirin to prevent strokes.  Diabetes screening is done by taking a blood sample to check your blood glucose level after you have not eaten for a certain period of time (fasting). If you are not overweight and you do not have risk factors for diabetes, you should be screened once every 3 years starting at age 86. If you are overweight or obese and you are 3-87 years of age, you should be screened for diabetes every year as part of your cardiovascular risk assessment.  Breast cancer screening is essential preventive care for women. You should practice "breast self-awareness." This means understanding the normal appearance and feel of your breasts and may include breast self-examination. Any changes detected, no matter how small, should be reported to a health care provider. Women in their 66s and 30s should have a clinical breast exam (CBE) by a health care provider as part of a regular health exam every 1 to 3 years. After age 43, women should have a CBE every year. Starting at age 37, women should consider having a mammogram (breast X-ray test) every year. Women who have a family history of breast cancer should talk to their health care provider about genetic screening. Women at a high risk of breast cancer should talk to their health care providers about having an MRI and a mammogram every year.  Breast cancer gene (BRCA)-related cancer risk assessment is recommended for women who have family members with BRCA-related cancers. BRCA-related cancers include breast, ovarian, tubal, and peritoneal cancers. Having family members with these cancers may be associated with an increased risk for harmful changes (mutations) in the breast  cancer genes BRCA1 and BRCA2. Results of the assessment will determine the need for genetic counseling and BRCA1 and BRCA2 testing.  Your health care provider may recommend that you be screened regularly for cancer of the pelvic organs (ovaries, uterus, and vagina). This screening involves a pelvic examination, including checking for microscopic changes to the surface of your cervix (Pap test). You may be encouraged to have this screening done every 3 years, beginning at age 78.  For women ages 79-65, health care providers may recommend pelvic exams and Pap testing every 3 years, or they may recommend the Pap and pelvic exam, combined with testing for human papilloma virus (HPV), every 5 years. Some types of HPV increase your risk of cervical cancer. Testing for HPV may also be done on women of any age with unclear Pap test results.  Other health care providers may not recommend any screening for nonpregnant women who are considered low risk for pelvic cancer and who do not have symptoms. Ask your health care provider if a screening pelvic exam is right for  you.  If you have had past treatment for cervical cancer or a condition that could lead to cancer, you need Pap tests and screening for cancer for at least 20 years after your treatment. If Pap tests have been discontinued, your risk factors (such as having a new sexual partner) need to be reassessed to determine if screening should resume. Some women have medical problems that increase the chance of getting cervical cancer. In these cases, your health care provider may recommend more frequent screening and Pap tests.  Colorectal cancer can be detected and often prevented. Most routine colorectal cancer screening begins at the age of 50 years and continues through age 75 years. However, your health care provider may recommend screening at an earlier age if you have risk factors for colon cancer. On a yearly basis, your health care provider may provide  home test kits to check for hidden blood in the stool. Use of a small camera at the end of a tube, to directly examine the colon (sigmoidoscopy or colonoscopy), can detect the earliest forms of colorectal cancer. Talk to your health care provider about this at age 50, when routine screening begins. Direct exam of the colon should be repeated every 5-10 years through age 75 years, unless early forms of precancerous polyps or small growths are found.  People who are at an increased risk for hepatitis B should be screened for this virus. You are considered at high risk for hepatitis B if:  You were born in a country where hepatitis B occurs often. Talk with your health care provider about which countries are considered high risk.  Your parents were born in a high-risk country and you have not received a shot to protect against hepatitis B (hepatitis B vaccine).  You have HIV or AIDS.  You use needles to inject street drugs.  You live with, or have sex with, someone who has hepatitis B.  You get hemodialysis treatment.  You take certain medicines for conditions like cancer, organ transplantation, and autoimmune conditions.  Hepatitis C blood testing is recommended for all people born from 1945 through 1965 and any individual with known risks for hepatitis C.  Practice safe sex. Use condoms and avoid high-risk sexual practices to reduce the spread of sexually transmitted infections (STIs). STIs include gonorrhea, chlamydia, syphilis, trichomonas, herpes, HPV, and human immunodeficiency virus (HIV). Herpes, HIV, and HPV are viral illnesses that have no cure. They can result in disability, cancer, and death.  You should be screened for sexually transmitted illnesses (STIs) including gonorrhea and chlamydia if:  You are sexually active and are younger than 24 years.  You are older than 24 years and your health care provider tells you that you are at risk for this type of infection.  Your sexual  activity has changed since you were last screened and you are at an increased risk for chlamydia or gonorrhea. Ask your health care provider if you are at risk.  If you are at risk of being infected with HIV, it is recommended that you take a prescription medicine daily to prevent HIV infection. This is called preexposure prophylaxis (PrEP). You are considered at risk if:  You are sexually active and do not regularly use condoms or know the HIV status of your partner(s).  You take drugs by injection.  You are sexually active with a partner who has HIV.  Talk with your health care provider about whether you are at high risk of being infected with HIV. If   you choose to begin PrEP, you should first be tested for HIV. You should then be tested every 3 months for as long as you are taking PrEP.  Osteoporosis is a disease in which the bones lose minerals and strength with aging. This can result in serious bone fractures or breaks. The risk of osteoporosis can be identified using a bone density scan. Women ages 1 years and over and women at risk for fractures or osteoporosis should discuss screening with their health care providers. Ask your health care provider whether you should take a calcium supplement or vitamin D to reduce the rate of osteoporosis.  Menopause can be associated with physical symptoms and risks. Hormone replacement therapy is available to decrease symptoms and risks. You should talk to your health care provider about whether hormone replacement therapy is right for you.  Use sunscreen. Apply sunscreen liberally and repeatedly throughout the day. You should seek shade when your shadow is shorter than you. Protect yourself by wearing long sleeves, pants, a wide-brimmed hat, and sunglasses year round, whenever you are outdoors.  Once a month, do a whole body skin exam, using a mirror to look at the skin on your back. Tell your health care provider of new moles, moles that have irregular  borders, moles that are larger than a pencil eraser, or moles that have changed in shape or color.  Stay current with required vaccines (immunizations).  Influenza vaccine. All adults should be immunized every year.  Tetanus, diphtheria, and acellular pertussis (Td, Tdap) vaccine. Pregnant women should receive 1 dose of Tdap vaccine during each pregnancy. The dose should be obtained regardless of the length of time since the last dose. Immunization is preferred during the 27th-36th week of gestation. An adult who has not previously received Tdap or who does not know her vaccine status should receive 1 dose of Tdap. This initial dose should be followed by tetanus and diphtheria toxoids (Td) booster doses every 10 years. Adults with an unknown or incomplete history of completing a 3-dose immunization series with Td-containing vaccines should begin or complete a primary immunization series including a Tdap dose. Adults should receive a Td booster every 10 years.  Varicella vaccine. An adult without evidence of immunity to varicella should receive 2 doses or a second dose if she has previously received 1 dose. Pregnant females who do not have evidence of immunity should receive the first dose after pregnancy. This first dose should be obtained before leaving the health care facility. The second dose should be obtained 4-8 weeks after the first dose.  Human papillomavirus (HPV) vaccine. Females aged 13-26 years who have not received the vaccine previously should obtain the 3-dose series. The vaccine is not recommended for use in pregnant females. However, pregnancy testing is not needed before receiving a dose. If a female is found to be pregnant after receiving a dose, no treatment is needed. In that case, the remaining doses should be delayed until after the pregnancy. Immunization is recommended for any person with an immunocompromised condition through the age of 24 years if she did not get any or all doses  earlier. During the 3-dose series, the second dose should be obtained 4-8 weeks after the first dose. The third dose should be obtained 24 weeks after the first dose and 16 weeks after the second dose.  Zoster vaccine. One dose is recommended for adults aged 97 years or older unless certain conditions are present.  Measles, mumps, and rubella (MMR) vaccine. Adults born  before 1957 generally are considered immune to measles and mumps. Adults born in [birth year] or later should have 1 or more doses of MMR vaccine unless there is a contraindication to the vaccine or there is laboratory evidence of immunity to each of the three diseases. A routine second dose of MMR vaccine should be obtained at least 28 days after the first dose for students attending postsecondary schools, health care workers, or international travelers. People who received inactivated measles vaccine or an unknown type of measles vaccine during 1963-1967 should receive 2 doses of MMR vaccine. People who received inactivated mumps vaccine or an unknown type of mumps vaccine before 1979 and are at high risk for mumps infection should consider immunization with 2 doses of MMR vaccine. For females of childbearing age, rubella immunity should be determined. If there is no evidence of immunity, females who are not pregnant should be vaccinated. If there is no evidence of immunity, females who are pregnant should delay immunization until after pregnancy. Unvaccinated health care workers born before 60 who lack laboratory evidence of measles, mumps, or rubella immunity or laboratory confirmation of disease should consider measles and mumps immunization with 2 doses of MMR vaccine or rubella immunization with 1 dose of MMR vaccine.  Pneumococcal 13-valent conjugate (PCV13) vaccine. When indicated, a person who is uncertain of his immunization history and has no record of immunization should receive the PCV13 vaccine. All adults 61 years of age and older  should receive this vaccine. An adult aged 92 years or older who has certain medical conditions and has not been previously immunized should receive 1 dose of PCV13 vaccine. This PCV13 should be followed with a dose of pneumococcal polysaccharide (PPSV23) vaccine. Adults who are at high risk for pneumococcal disease should obtain the PPSV23 vaccine at least 8 weeks after the dose of PCV13 vaccine. Adults older than 28 years of age who have normal immune system function should obtain the PPSV23 vaccine dose at least 1 year after the dose of PCV13 vaccine.  Pneumococcal polysaccharide (PPSV23) vaccine. When PCV13 is also indicated, PCV13 should be obtained first. All adults aged 2 years and older should be immunized. An adult younger than age 30 years who has certain medical conditions should be immunized. Any person who resides in a nursing home or long-term care facility should be immunized. An adult smoker should be immunized. People with an immunocompromised condition and certain other conditions should receive both PCV13 and PPSV23 vaccines. People with human immunodeficiency virus (HIV) infection should be immunized as soon as possible after diagnosis. Immunization during chemotherapy or radiation therapy should be avoided. Routine use of PPSV23 vaccine is not recommended for American Indians, [place] Natives, or people younger than 65 years unless there are medical conditions that require PPSV23 vaccine. When indicated, people who have unknown immunization and have no record of immunization should receive PPSV23 vaccine. One-time revaccination 5 years after the first dose of PPSV23 is recommended for people aged 19-64 years who have chronic kidney failure, nephrotic syndrome, asplenia, or immunocompromised conditions. People who received 1-2 doses of PPSV23 before age 44 years should receive another dose of PPSV23 vaccine at age 83 years or later if at least 5 years have passed since the previous dose. Doses  of PPSV23 are not needed for people immunized with PPSV23 at or after age 20 years.  Meningococcal vaccine. Adults with asplenia or persistent complement component deficiencies should receive 2 doses of quadrivalent meningococcal conjugate (MenACWY-D) vaccine. The doses should be obtained  at least 2 months apart. Microbiologists working with certain meningococcal bacteria, [place] recruits, people at risk during an outbreak, and people who travel to or live in countries with a high rate of meningitis should be immunized. A first-year college student up through age 28 years who is living in a residence hall should receive a dose if she did not receive a dose on or after her 16th birthday. Adults who have certain high-risk conditions should receive one or more doses of vaccine.  Hepatitis A vaccine. Adults who wish to be protected from this disease, have certain high-risk conditions, work with hepatitis A-infected animals, work in hepatitis A research labs, or travel to or work in countries with a high rate of hepatitis A should be immunized. Adults who were previously unvaccinated and who anticipate close contact with an international adoptee during the first 60 days after arrival in the [place] from a country with a high rate of hepatitis A should be immunized.  Hepatitis B vaccine. Adults who wish to be protected from this disease, have certain high-risk conditions, may be exposed to blood or other infectious body fluids, are household contacts or sex partners of hepatitis B positive people, are clients or workers in certain care facilities, or travel to or work in countries with a high rate of hepatitis B should be immunized.  Haemophilus influenzae type b (Hib) vaccine. A previously unvaccinated person with asplenia or sickle cell disease or having a scheduled splenectomy should receive 1 dose of Hib vaccine. Regardless of previous immunization, a recipient of a hematopoietic stem cell transplant  should receive a 3-dose series 6-12 months after her successful transplant. Hib vaccine is not recommended for adults with HIV infection. Preventive Services / Frequency Ages 71 to 87 years  Blood pressure check.** / Every 3-5 years.  Lipid and cholesterol check.** / Every 5 years beginning at age 1.  Clinical breast exam.** / Every 3 years for women in their 3s and 31s.  BRCA-related cancer risk assessment.** / For women who have family members with a BRCA-related cancer (breast, ovarian, tubal, or peritoneal cancers).  Pap test.** / Every 2 years from ages 50 through 86. Every 3 years starting at age 87 through age 7 or 75 with a history of 3 consecutive normal Pap tests.  HPV screening.** / Every 3 years from ages 59 through ages 35 to 6 with a history of 3 consecutive normal Pap tests.  Hepatitis C blood test.** / For any individual with known risks for hepatitis C.  Skin self-exam. / Monthly.  Influenza vaccine. / Every year.  Tetanus, diphtheria, and acellular pertussis (Tdap, Td) vaccine.** / Consult your health care provider. Pregnant women should receive 1 dose of Tdap vaccine during each pregnancy. 1 dose of Td every 10 years.  Varicella vaccine.** / Consult your health care provider. Pregnant females who do not have evidence of immunity should receive the first dose after pregnancy.  HPV vaccine. / 3 doses over 6 months, if 72 and younger. The vaccine is not recommended for use in pregnant females. However, pregnancy testing is not needed before receiving a dose.  Measles, mumps, rubella (MMR) vaccine.** / You need at least 1 dose of MMR if you were born in 1957 or later. You may also need a 2nd dose. For females of childbearing age, rubella immunity should be determined. If there is no evidence of immunity, females who are not pregnant should be vaccinated. If there is no evidence of immunity, females who are  pregnant should delay immunization until after  pregnancy.  Pneumococcal 13-valent conjugate (PCV13) vaccine.** / Consult your health care provider.  Pneumococcal polysaccharide (PPSV23) vaccine.** / 1 to 2 doses if you smoke cigarettes or if you have certain conditions.  Meningococcal vaccine.** / 1 dose if you are age 87 to 44 years and a Market researcher living in a residence hall, or have one of several medical conditions, you need to get vaccinated against meningococcal disease. You may also need additional booster doses.  Hepatitis A vaccine.** / Consult your health care provider.  Hepatitis B vaccine.** / Consult your health care provider.  Haemophilus influenzae type b (Hib) vaccine.** / Consult your health care provider. Ages 86 to 38 years  Blood pressure check.** / Every year.  Lipid and cholesterol check.** / Every 5 years beginning at age 49 years.  Lung cancer screening. / Every year if you are aged 71-80 years and have a 30-pack-year history of smoking and currently smoke or have quit within the past 15 years. Yearly screening is stopped once you have quit smoking for at least 15 years or develop a health problem that would prevent you from having lung cancer treatment.  Clinical breast exam.** / Every year after age 51 years.  BRCA-related cancer risk assessment.** / For women who have family members with a BRCA-related cancer (breast, ovarian, tubal, or peritoneal cancers).  Mammogram.** / Every year beginning at age 18 years and continuing for as long as you are in good health. Consult with your health care provider.  Pap test.** / Every 3 years starting at age 63 years through age 37 or 57 years with a history of 3 consecutive normal Pap tests.  HPV screening.** / Every 3 years from ages 41 years through ages 76 to 23 years with a history of 3 consecutive normal Pap tests.  Fecal occult blood test (FOBT) of stool. / Every year beginning at age 36 years and continuing until age 51 years. You may not need  to do this test if you get a colonoscopy every 10 years.  Flexible sigmoidoscopy or colonoscopy.** / Every 5 years for a flexible sigmoidoscopy or every 10 years for a colonoscopy beginning at age 36 years and continuing until age 35 years.  Hepatitis C blood test.** / For all people born from [birth year] through 1963 and any individual with known risks for hepatitis C.  Skin self-exam. / Monthly.  Influenza vaccine. / Every year.  Tetanus, diphtheria, and acellular pertussis (Tdap/Td) vaccine.** / Consult your health care provider. Pregnant women should receive 1 dose of Tdap vaccine during each pregnancy. 1 dose of Td every 10 years.  Varicella vaccine.** / Consult your health care provider. Pregnant females who do not have evidence of immunity should receive the first dose after pregnancy.  Zoster vaccine.** / 1 dose for adults aged 73 years or older.  Measles, mumps, rubella (MMR) vaccine.** / You need at least 1 dose of MMR if you were born in 1957 or later. You may also need a second dose. For females of childbearing age, rubella immunity should be determined. If there is no evidence of immunity, females who are not pregnant should be vaccinated. If there is no evidence of immunity, females who are pregnant should delay immunization until after pregnancy.  Pneumococcal 13-valent conjugate (PCV13) vaccine.** / Consult your health care provider.  Pneumococcal polysaccharide (PPSV23) vaccine.** / 1 to 2 doses if you smoke cigarettes or if you have certain conditions.  Meningococcal vaccine.** /  Consult your health care provider.  Hepatitis A vaccine.** / Consult your health care provider.  Hepatitis B vaccine.** / Consult your health care provider.  Haemophilus influenzae type b (Hib) vaccine.** / Consult your health care provider. Ages 80 years and over  Blood pressure check.** / Every year.  Lipid and cholesterol check.** / Every 5 years beginning at age 62 years.  Lung cancer  screening. / Every year if you are aged 32-80 years and have a 30-pack-year history of smoking and currently smoke or have quit within the past 15 years. Yearly screening is stopped once you have quit smoking for at least 15 years or develop a health problem that would prevent you from having lung cancer treatment.  Clinical breast exam.** / Every year after age 61 years.  BRCA-related cancer risk assessment.** / For women who have family members with a BRCA-related cancer (breast, ovarian, tubal, or peritoneal cancers).  Mammogram.** / Every year beginning at age 39 years and continuing for as long as you are in good health. Consult with your health care provider.  Pap test.** / Every 3 years starting at age 85 years through age 74 or 72 years with 3 consecutive normal Pap tests. Testing can be stopped between 65 and 70 years with 3 consecutive normal Pap tests and no abnormal Pap or HPV tests in the past 10 years.  HPV screening.** / Every 3 years from ages 55 years through ages 67 or 77 years with a history of 3 consecutive normal Pap tests. Testing can be stopped between 65 and 70 years with 3 consecutive normal Pap tests and no abnormal Pap or HPV tests in the past 10 years.  Fecal occult blood test (FOBT) of stool. / Every year beginning at age 81 years and continuing until age 22 years. You may not need to do this test if you get a colonoscopy every 10 years.  Flexible sigmoidoscopy or colonoscopy.** / Every 5 years for a flexible sigmoidoscopy or every 10 years for a colonoscopy beginning at age 67 years and continuing until age 22 years.  Hepatitis C blood test.** / For all people born from [birth year] through 1963 and any individual with known risks for hepatitis C.  Osteoporosis screening.** / A one-time screening for women ages 8 years and over and women at risk for fractures or osteoporosis.  Skin self-exam. / Monthly.  Influenza vaccine. / Every year.  Tetanus, diphtheria, and  acellular pertussis (Tdap/Td) vaccine.** / 1 dose of Td every 10 years.  Varicella vaccine.** / Consult your health care provider.  Zoster vaccine.** / 1 dose for adults aged 56 years or older.  Pneumococcal 13-valent conjugate (PCV13) vaccine.** / Consult your health care provider.  Pneumococcal polysaccharide (PPSV23) vaccine.** / 1 dose for all adults aged 15 years and older.  Meningococcal vaccine.** / Consult your health care provider.  Hepatitis A vaccine.** / Consult your health care provider.  Hepatitis B vaccine.** / Consult your health care provider.  Haemophilus influenzae type b (Hib) vaccine.** / Consult your health care provider. ** Family history and personal history of risk and conditions may change your health care provider's recommendations.   This information is not intended to replace advice given to you by your health care provider. Make sure you discuss any questions you have with your health care provider.   Document Released: 06/22/2001 Document Revised: 05/17/2014 Document Reviewed: 09/21/2010 Elsevier Interactive Patient Education [employer].

## 2016-02-20 ENCOUNTER — Other Ambulatory Visit: Payer: Self-pay | Admitting: Obstetrics and Gynecology

## 2016-02-20 LAB — COMPREHENSIVE METABOLIC PANEL
ALT: 15 IU/L (ref 0–32)
AST: 15 IU/L (ref 0–40)
Albumin/Globulin Ratio: 1.5 (ref 1.2–2.2)
Albumin: 4.4 g/dL (ref 3.5–5.5)
Alkaline Phosphatase: 55 IU/L (ref 39–117)
BILIRUBIN TOTAL: 0.3 mg/dL (ref 0.0–1.2)
BUN/Creatinine Ratio: 12 (ref 9–23)
BUN: 9 mg/dL (ref 6–20)
CALCIUM: 9.3 mg/dL (ref 8.7–10.2)
CHLORIDE: 101 mmol/L (ref 96–106)
CO2: 23 mmol/L (ref 18–29)
Creatinine, Ser: 0.73 mg/dL (ref 0.57–1.00)
GFR calc non Af Amer: 112 mL/min/{1.73_m2} (ref >59)
GFR, EST AFRICAN AMERICAN: 130 mL/min/{1.73_m2} (ref >59)
GLUCOSE: 90 mg/dL (ref 65–99)
Globulin, Total: 3 g/dL (ref 1.5–4.5)
Potassium: 4 mmol/L (ref 3.5–5.2)
Sodium: 141 mmol/L (ref 134–144)
TOTAL PROTEIN: 7.4 g/dL (ref 6.0–8.5)

## 2016-02-20 LAB — CBC
HEMATOCRIT: 39.3 % (ref 34.0–46.6)
Hemoglobin: 12.9 g/dL (ref 11.1–15.9)
MCH: 28.2 pg (ref 26.6–33.0)
MCHC: 32.8 g/dL (ref 31.5–35.7)
MCV: 86 fL (ref 79–97)
PLATELETS: 288 10*3/uL (ref 150–379)
RBC: 4.58 x10E6/uL (ref 3.77–5.28)
RDW: 13.9 % (ref 12.3–15.4)
WBC: 6.4 10*3/uL (ref 3.4–10.8)

## 2016-02-20 LAB — LIPID PANEL
CHOL/HDL RATIO: 5.3 ratio — AB (ref 0.0–4.4)
Cholesterol, Total: 170 mg/dL (ref 100–199)
HDL: 32 mg/dL — AB (ref >39)
LDL Calculated: 101 mg/dL — ABNORMAL HIGH (ref 0–99)
Triglycerides: 183 mg/dL — ABNORMAL HIGH (ref 0–149)
VLDL Cholesterol Cal: 37 mg/dL (ref 5–40)

## 2016-02-20 LAB — VITAMIN D 25 HYDROXY (VIT D DEFICIENCY, FRACTURES): VIT D 25 HYDROXY: 24.7 ng/mL — AB (ref 30.0–100.0)

## 2016-02-20 LAB — THYROID PANEL WITH TSH
Free Thyroxine Index: 2.2 (ref 1.2–4.9)
T3 UPTAKE RATIO: 27 % (ref 24–39)
T4 TOTAL: 8 ug/dL (ref 4.5–12.0)
TSH: 5.96 u[IU]/mL — AB (ref 0.450–4.500)

## 2016-02-20 LAB — BETA HCG QUANT (REF LAB): hCG Quant: <1 m[IU]/mL

## 2016-02-20 LAB — HEMOGLOBIN A1C
Est. average glucose Bld gHb Est-mCnc: 97 mg/dL
Hgb A1c MFr Bld: 5 % (ref 4.8–5.6)

## 2016-02-20 MED ORDER — VITAMIN D (ERGOCALCIFEROL) 1.25 MG (50000 UNIT) PO CAPS: 50000.0000 [IU] | ORAL_CAPSULE | ORAL | 1 refills | Status: DC

## 2016-09-13 DIAGNOSIS — J019 Acute sinusitis, unspecified: Secondary | ICD-10-CM | POA: Diagnosis not present

## 2016-11-24 DIAGNOSIS — Z0000 Encounter for general adult medical examination without abnormal findings: Secondary | ICD-10-CM | POA: Diagnosis not present

## 2016-11-24 DIAGNOSIS — E039 Hypothyroidism, unspecified: Secondary | ICD-10-CM | POA: Diagnosis not present

## 2016-11-24 DIAGNOSIS — E559 Vitamin D deficiency, unspecified: Secondary | ICD-10-CM | POA: Diagnosis not present

## 2016-11-24 DIAGNOSIS — E784 Other hyperlipidemia: Secondary | ICD-10-CM | POA: Diagnosis not present

## 2016-11-25 DIAGNOSIS — Z0000 Encounter for general adult medical examination without abnormal findings: Secondary | ICD-10-CM | POA: Diagnosis not present

## 2016-11-25 DIAGNOSIS — R202 Paresthesia of skin: Secondary | ICD-10-CM | POA: Diagnosis not present

## 2016-11-25 DIAGNOSIS — E784 Other hyperlipidemia: Secondary | ICD-10-CM | POA: Diagnosis not present

## 2016-11-25 DIAGNOSIS — R5383 Other fatigue: Secondary | ICD-10-CM | POA: Diagnosis not present

## 2016-11-25 DIAGNOSIS — E039 Hypothyroidism, unspecified: Secondary | ICD-10-CM | POA: Diagnosis not present

## 2016-11-25 DIAGNOSIS — E559 Vitamin D deficiency, unspecified: Secondary | ICD-10-CM | POA: Diagnosis not present

## 2016-12-29 DIAGNOSIS — E039 Hypothyroidism, unspecified: Secondary | ICD-10-CM | POA: Diagnosis not present

## 2016-12-29 DIAGNOSIS — E559 Vitamin D deficiency, unspecified: Secondary | ICD-10-CM | POA: Diagnosis not present

## 2016-12-29 DIAGNOSIS — E784 Other hyperlipidemia: Secondary | ICD-10-CM | POA: Diagnosis not present

## 2017-01-30 DIAGNOSIS — Z23 Encounter for immunization: Secondary | ICD-10-CM | POA: Diagnosis not present

## 2017-02-23 ENCOUNTER — Ambulatory Visit (INDEPENDENT_AMBULATORY_CARE_PROVIDER_SITE_OTHER): Payer: BLUE CROSS/BLUE SHIELD | Admitting: Obstetrics and Gynecology

## 2017-02-23 ENCOUNTER — Other Ambulatory Visit: Payer: Self-pay | Admitting: Obstetrics and Gynecology

## 2017-02-23 ENCOUNTER — Encounter: Payer: Self-pay | Admitting: Obstetrics and Gynecology

## 2017-02-23 VITALS — BP 125/84 | HR 75 | Ht 70.0 in | Wt 242.4 lb

## 2017-02-23 DIAGNOSIS — Z01419 Encounter for gynecological examination (general) (routine) without abnormal findings: Secondary | ICD-10-CM

## 2017-02-23 DIAGNOSIS — E669 Obesity, unspecified: Secondary | ICD-10-CM

## 2017-02-23 DIAGNOSIS — R102 Pelvic and perineal pain: Secondary | ICD-10-CM

## 2017-02-23 DIAGNOSIS — Z842 Family history of other diseases of the genitourinary system: Secondary | ICD-10-CM | POA: Diagnosis not present

## 2017-02-23 MED ORDER — NORETHIN ACE-ETH ESTRAD-FE 1-20 MG-MCG(24) PO TABS: 1.0000 | ORAL_TABLET | Freq: Every day | ORAL | 11 refills | Status: DC

## 2017-02-23 MED ORDER — PHENTERMINE HCL 37.5 MG PO TABS: 37.5000 mg | ORAL_TABLET | Freq: Every day | ORAL | 2 refills | Status: DC

## 2017-02-23 MED ORDER — CYANOCOBALAMIN 1000 MCG/ML IJ SOLN: 1000.0000 ug | INTRAMUSCULAR | 1 refills | Status: DC

## 2017-02-23 NOTE — Progress Notes (Signed)
   Subjective:     Kristi [name] is a married white 31 y.o. female and is here for a comprehensive physical exam. [id number] working FT at [employer]. The patient reports problems - still having pelvic pain and pain with sex. menses are very painful. .  Social History   Social History  . Marital status: Married    Spouse name: N/A  . Number of children: N/A  . Years of education: N/A   Occupational History  . Not on file.   Social History Main Topics  . Smoking status: Never Smoker  . Smokeless tobacco: Never Used  . Alcohol use No  . Drug use: No  . Sexual activity: Yes    Birth control/ protection: None, Condom   Other Topics Concern  . Not on file   Social History Narrative   [facility].   Health Maintenance  Topic Date Due  . HIV Screening  07/28/2002  . PAP SMEAR  12/19/2012  . INFLUENZA VACCINE  12/08/2016  . TETANUS/TDAP  01/20/2021    The following portions of the patient's history were reviewed and updated as appropriate: allergies, current medications, past family history, past medical history, past social history, past surgical history and problem list.  Review of Systems Pertinent items noted in HPI and remainder of comprehensive ROS otherwise negative.   Objective:    General appearance: alert, cooperative and appears stated age Neck: no adenopathy, no carotid bruit, no JVD, supple, symmetrical, trachea midline and thyroid not enlarged, symmetric, no tenderness/mass/nodules Lungs: clear to auscultation bilaterally Breasts: normal appearance, no masses or tenderness Heart: regular rate and rhythm, S1, S2 normal, no murmur, click, rub or gallop Abdomen: soft, non-tender; bowel sounds normal; no masses,  no organomegaly Pelvic: cervix normal in appearance, external genitalia normal, no adnexal masses or tenderness, no cervical motion tenderness, rectovaginal septum normal, uterus normal size, shape, and consistency and vagina normal without discharge     Assessment:    Healthy female exam. Obesity; hypothyroidism; endometriosis     Plan:  Labs obtained- will follow up accordingly Encouraged hormonal suppression of menses and restarting weight loss plan RTC 1 month for wt/bp/B12 RTC 1 year or as needed  Melody Shambley,CNM   See After Visit Summary for Counseling Recommendations

## 2017-02-23 NOTE — Patient Instructions (Signed)
Preventive Care 18-39 Years, Female Preventive care refers to lifestyle choices and visits with your health care provider that can promote health and wellness. What does preventive care include?  A yearly physical exam. This is also called an annual well check.  Dental exams once or twice a year.  Routine eye exams. Ask your health care provider how often you should have your eyes checked.  Personal lifestyle choices, including: ? Daily care of your teeth and gums. ? Regular physical activity. ? Eating a healthy diet. ? Avoiding tobacco and drug use. ? Limiting alcohol use. ? Practicing safe sex. ? Taking vitamin and mineral supplements as recommended by your health care provider. What happens during an annual well check? The services and screenings done by your health care provider during your annual well check will depend on your age, overall health, lifestyle risk factors, and family history of disease. Counseling Your health care provider may ask you questions about your:  Alcohol use.  Tobacco use.  Drug use.  Emotional well-being.  Home and relationship well-being.  Sexual activity.  Eating habits.  Work and work Statistician.  Method of birth control.  Menstrual cycle.  Pregnancy history.  Screening You may have the following tests or measurements:  Height, weight, and BMI.  Diabetes screening. This is done by checking your blood sugar (glucose) after you have not eaten for a while (fasting).  Blood pressure.  Lipid and cholesterol levels. These may be checked every 5 years starting at age 38.  Skin check.  Hepatitis C blood test.  Hepatitis B blood test.  Sexually transmitted disease (STD) testing.  BRCA-related cancer screening. This may be done if you have a family history of breast, ovarian, tubal, or peritoneal cancers.  Pelvic exam and Pap test. This may be done every 3 years starting at age 38. Starting at age 30, this may be done  every 5 years if you have a Pap test in combination with an HPV test.  Discuss your test results, treatment options, and if necessary, the need for more tests with your health care provider. Vaccines Your health care provider may recommend certain vaccines, such as:  Influenza vaccine. This is recommended every year.  Tetanus, diphtheria, and acellular pertussis (Tdap, Td) vaccine. You may need a Td booster every 10 years.  Varicella vaccine. You may need this if you have not been vaccinated.  HPV vaccine. If you are 39 or younger, you may need three doses over 6 months.  Measles, mumps, and rubella (MMR) vaccine. You may need at least one dose of MMR. You may also need a second dose.  Pneumococcal 13-valent conjugate (PCV13) vaccine. You may need this if you have certain conditions and were not previously vaccinated.  Pneumococcal polysaccharide (PPSV23) vaccine. You may need one or two doses if you smoke cigarettes or if you have certain conditions.  Meningococcal vaccine. One dose is recommended if you are age 68-21 years and a first-year college student living in a residence hall, or if you have one of several medical conditions. You may also need additional booster doses.  Hepatitis A vaccine. You may need this if you have certain conditions or if you travel or work in places where you may be exposed to hepatitis A.  Hepatitis B vaccine. You may need this if you have certain conditions or if you travel or work in places where you may be exposed to hepatitis B.  Haemophilus influenzae type b (Hib) vaccine. You may need this  if you have certain risk factors.  Talk to your health care provider about which screenings and vaccines you need and how often you need them. This information is not intended to replace advice given to you by your health care provider. Make sure you discuss any questions you have with your health care provider. Document Released: 06/22/2001 Document Revised:  01/14/2016 Document Reviewed: 02/25/2015 Elsevier Interactive Patient Education  2017 Elsevier Inc[employer]  

## 2017-02-24 LAB — CYTOLOGY - PAP

## 2017-05-26 DIAGNOSIS — J0101 Acute recurrent maxillary sinusitis: Secondary | ICD-10-CM | POA: Diagnosis not present

## 2017-05-26 DIAGNOSIS — R509 Fever, unspecified: Secondary | ICD-10-CM | POA: Diagnosis not present

## 2017-06-07 ENCOUNTER — Other Ambulatory Visit: Payer: Self-pay | Admitting: *Deleted

## 2017-06-07 ENCOUNTER — Encounter: Payer: Self-pay | Admitting: Obstetrics and Gynecology

## 2017-06-07 MED ORDER — FLUCONAZOLE 150 MG PO TABS: 150.0000 mg | ORAL_TABLET | Freq: Once | ORAL | 2 refills | Status: AC

## 2017-06-24 DIAGNOSIS — E039 Hypothyroidism, unspecified: Secondary | ICD-10-CM | POA: Diagnosis not present

## 2017-06-24 DIAGNOSIS — E7849 Other hyperlipidemia: Secondary | ICD-10-CM | POA: Diagnosis not present

## 2017-06-24 DIAGNOSIS — E559 Vitamin D deficiency, unspecified: Secondary | ICD-10-CM | POA: Diagnosis not present

## 2017-07-07 ENCOUNTER — Other Ambulatory Visit: Payer: Self-pay | Admitting: Internal Medicine

## 2017-07-07 ENCOUNTER — Ambulatory Visit
Admission: RE | Admit: 2017-07-07 | Discharge: 2017-07-07 | Disposition: A | Discharge status: Home / Self Care | Payer: BLUE CROSS/BLUE SHIELD | Source: Ambulatory Visit | Attending: Internal Medicine | Admitting: Internal Medicine

## 2017-07-07 DIAGNOSIS — R3129 Other microscopic hematuria: Secondary | ICD-10-CM

## 2017-07-07 DIAGNOSIS — R109 Unspecified abdominal pain: Secondary | ICD-10-CM

## 2017-07-07 DIAGNOSIS — K439 Ventral hernia without obstruction or gangrene: Secondary | ICD-10-CM | POA: Insufficient documentation

## 2017-07-07 DIAGNOSIS — E7849 Other hyperlipidemia: Secondary | ICD-10-CM | POA: Diagnosis not present

## 2017-07-07 DIAGNOSIS — E559 Vitamin D deficiency, unspecified: Secondary | ICD-10-CM | POA: Diagnosis not present

## 2017-07-07 DIAGNOSIS — E039 Hypothyroidism, unspecified: Secondary | ICD-10-CM | POA: Diagnosis not present

## 2017-07-18 ENCOUNTER — Ambulatory Visit: Payer: 59

## 2018-01-25 DIAGNOSIS — E7849 Other hyperlipidemia: Secondary | ICD-10-CM | POA: Diagnosis not present

## 2018-01-25 DIAGNOSIS — E559 Vitamin D deficiency, unspecified: Secondary | ICD-10-CM | POA: Diagnosis not present

## 2018-01-25 DIAGNOSIS — R1013 Epigastric pain: Secondary | ICD-10-CM | POA: Diagnosis not present

## 2018-01-25 DIAGNOSIS — E039 Hypothyroidism, unspecified: Secondary | ICD-10-CM | POA: Diagnosis not present

## 2018-01-26 ENCOUNTER — Other Ambulatory Visit: Payer: Self-pay | Admitting: Internal Medicine

## 2018-01-26 DIAGNOSIS — R1013 Epigastric pain: Secondary | ICD-10-CM

## 2018-03-14 ENCOUNTER — Encounter: Payer: Self-pay | Admitting: Obstetrics and Gynecology

## 2018-03-14 ENCOUNTER — Ambulatory Visit (INDEPENDENT_AMBULATORY_CARE_PROVIDER_SITE_OTHER): Payer: BLUE CROSS/BLUE SHIELD | Admitting: Obstetrics and Gynecology

## 2018-03-14 VITALS — BP 122/81 | HR 75 | Ht 70.0 in | Wt 234.6 lb

## 2018-03-14 DIAGNOSIS — N809 Endometriosis, unspecified: Secondary | ICD-10-CM | POA: Diagnosis not present

## 2018-03-14 DIAGNOSIS — Z01419 Encounter for gynecological examination (general) (routine) without abnormal findings: Secondary | ICD-10-CM

## 2018-03-14 MED ORDER — IBUPROFEN 800 MG PO TABS: 800.0000 mg | ORAL_TABLET | Freq: Three times a day (TID) | ORAL | 3 refills | Status: DC | PRN

## 2018-03-14 MED ORDER — TRANEXAMIC ACID 650 MG PO TABS: 1300.0000 mg | ORAL_TABLET | Freq: Three times a day (TID) | ORAL | 2 refills | Status: DC

## 2018-03-14 NOTE — Progress Notes (Signed)
   PT is present today for her annual exam. Pt stated that she has not been doing self-breast exams monthly. Pt stated that she is doing well and denies any issues. Pt would like to speak with [facility] about some pain she has been having. No other complaints.  Pt declined flu vaccine.

## 2018-03-14 NOTE — Patient Instructions (Signed)
Health Maintenance, Female Adopting a healthy lifestyle and getting preventive care can go a long way to promote health and wellness. Talk with your health care provider about what schedule of regular examinations is right for you. This is a good chance for you to check in with your provider about disease prevention and staying healthy. In between checkups, there are plenty of things you can do on your own. Experts have done a lot of research about which lifestyle changes and preventive measures are most likely to keep you healthy. Ask your health care provider for more information. Weight and diet Eat a healthy diet  Be sure to include plenty of vegetables, fruits, low-fat dairy products, and lean protein.  Do not eat a lot of foods high in solid fats, added sugars, or salt.  Get regular exercise. This is one of the most important things you can do for your health. ? Most adults should exercise for at least 150 minutes each week. The exercise should increase your heart rate and make you sweat (moderate-intensity exercise). ? Most adults should also do strengthening exercises at least twice a week. This is in addition to the moderate-intensity exercise.  Maintain a healthy weight  Body mass index (BMI) is a measurement that can be used to identify possible weight problems. It estimates body fat based on height and weight. Your health care provider can help determine your BMI and help you achieve or maintain a healthy weight.  For females 20 years of age and older: ? A BMI below 18.5 is considered underweight. ? A BMI of 18.5 to 24.9 is normal. ? A BMI of 25 to 29.9 is considered overweight. ? A BMI of 30 and above is considered obese.  Watch levels of cholesterol and blood lipids  You should start having your blood tested for lipids and cholesterol at 30 years of age, then have this test every 5 years.  You may need to have your cholesterol levels checked more often if: ? Your lipid or  cholesterol levels are high. ? You are older than 30 years of age. ? You are at high risk for heart disease.  Cancer screening Lung Cancer  Lung cancer screening is recommended for adults 55-80 years old who are at high risk for lung cancer because of a history of smoking.  A yearly low-dose CT scan of the lungs is recommended for people who: ? Currently smoke. ? Have quit within the past 15 years. ? Have at least a 30-pack-year history of smoking. A pack year is smoking an average of one pack of cigarettes a day for 1 year.  Yearly screening should continue until it has been 15 years since you quit.  Yearly screening should stop if you develop a health problem that would prevent you from having lung cancer treatment.  Breast Cancer  Practice breast self-awareness. This means understanding how your breasts normally appear and feel.  It also means doing regular breast self-exams. Let your health care provider know about any changes, no matter how small.  If you are in your 20s or 30s, you should have a clinical breast exam (CBE) by a health care provider every 1-3 years as part of a regular health exam.  If you are 40 or older, have a CBE every year. Also consider having a breast X-ray (mammogram) every year.  If you have a family history of breast cancer, talk to your health care provider about genetic screening.  If you are at high risk   for breast cancer, talk to your health care provider about having an MRI and a mammogram every year.  Breast cancer gene (BRCA) assessment is recommended for women who have family members with BRCA-related cancers. BRCA-related cancers include: ? Breast. ? Ovarian. ? Tubal. ? Peritoneal cancers.  Results of the assessment will determine the need for genetic counseling and BRCA1 and BRCA2 testing.  Cervical Cancer Your health care provider may recommend that you be screened regularly for cancer of the pelvic organs (ovaries, uterus, and  vagina). This screening involves a pelvic examination, including checking for microscopic changes to the surface of your cervix (Pap test). You may be encouraged to have this screening done every 3 years, beginning at age 22.  For women ages 56-65, health care providers may recommend pelvic exams and Pap testing every 3 years, or they may recommend the Pap and pelvic exam, combined with testing for human papilloma virus (HPV), every 5 years. Some types of HPV increase your risk of cervical cancer. Testing for HPV may also be done on women of any age with unclear Pap test results.  Other health care providers may not recommend any screening for nonpregnant women who are considered low risk for pelvic cancer and who do not have symptoms. Ask your health care provider if a screening pelvic exam is right for you.  If you have had past treatment for cervical cancer or a condition that could lead to cancer, you need Pap tests and screening for cancer for at least 20 years after your treatment. If Pap tests have been discontinued, your risk factors (such as having a new sexual partner) need to be reassessed to determine if screening should resume. Some women have medical problems that increase the chance of getting cervical cancer. In these cases, your health care provider may recommend more frequent screening and Pap tests.  Colorectal Cancer  This type of cancer can be detected and often prevented.  Routine colorectal cancer screening usually begins at 30 years of age and continues through 30 years of age.  Your health care provider may recommend screening at an earlier age if you have risk factors for colon cancer.  Your health care provider may also recommend using home test kits to check for hidden blood in the stool.  A small camera at the end of a tube can be used to examine your colon directly (sigmoidoscopy or colonoscopy). This is done to check for the earliest forms of colorectal  cancer.  Routine screening usually begins at age 33.  Direct examination of the colon should be repeated every 5-10 years through 30 years of age. However, you may need to be screened more often if early forms of precancerous polyps or small growths are found.  Skin Cancer  Check your skin from head to toe regularly.  Tell your health care provider about any new moles or changes in moles, especially if there is a change in a mole's shape or color.  Also tell your health care provider if you have a mole that is larger than the size of a pencil eraser.  Always use sunscreen. Apply sunscreen liberally and repeatedly throughout the day.  Protect yourself by wearing long sleeves, pants, a wide-brimmed hat, and sunglasses whenever you are outside.  Heart disease, diabetes, and high blood pressure  High blood pressure causes heart disease and increases the risk of stroke. High blood pressure is more likely to develop in: ? People who have blood pressure in the high end of  the normal range (130-139/85-89 mm Hg). ? People who are overweight or obese. ? People who are African American.  If you are 21-29 years of age, have your blood pressure checked every 3-5 years. If you are 3 years of age or older, have your blood pressure checked every year. You should have your blood pressure measured twice-once when you are at a hospital or clinic, and once when you are not at a hospital or clinic. Record the average of the two measurements. To check your blood pressure when you are not at a hospital or clinic, you can use: ? An automated blood pressure machine at a pharmacy. ? A home blood pressure monitor.  If you are between 17 years and 37 years old, ask your health care provider if you should take aspirin to prevent strokes.  Have regular diabetes screenings. This involves taking a blood sample to check your fasting blood sugar level. ? If you are at a normal weight and have a low risk for diabetes,  have this test once every three years after 30 years of age. ? If you are overweight and have a high risk for diabetes, consider being tested at a younger age or more often. Preventing infection Hepatitis B  If you have a higher risk for hepatitis B, you should be screened for this virus. You are considered at high risk for hepatitis B if: ? You were born in a country where hepatitis B is common. Ask your health care provider which countries are considered high risk. ? Your parents were born in a high-risk country, and you have not been immunized against hepatitis B (hepatitis B vaccine). ? You have HIV or AIDS. ? You use needles to inject street drugs. ? You live with someone who has hepatitis B. ? You have had sex with someone who has hepatitis B. ? You get hemodialysis treatment. ? You take certain medicines for conditions, including cancer, organ transplantation, and autoimmune conditions.  Hepatitis C  Blood testing is recommended for: ? Everyone born from [birth year] through 1963. ? Anyone with known risk factors for hepatitis C.  Sexually transmitted infections (STIs)  You should be screened for sexually transmitted infections (STIs) including gonorrhea and chlamydia if: ? You are sexually active and are younger than 30 years of age. ? You are older than 30 years of age and your health care provider tells you that you are at risk for this type of infection. ? Your sexual activity has changed since you were last screened and you are at an increased risk for chlamydia or gonorrhea. Ask your health care provider if you are at risk.  If you do not have HIV, but are at risk, it may be recommended that you take a prescription medicine daily to prevent HIV infection. This is called pre-exposure prophylaxis (PrEP). You are considered at risk if: ? You are sexually active and do not regularly use condoms or know the HIV status of your partner(s). ? You take drugs by injection. ? You are  sexually active with a partner who has HIV.  Talk with your health care provider about whether you are at high risk of being infected with HIV. If you choose to begin PrEP, you should first be tested for HIV. You should then be tested every 3 months for as long as you are taking PrEP. Pregnancy  If you are premenopausal and you may become pregnant, ask your health care provider about preconception counseling.  If you may become  pregnant, take 400 to 800 micrograms (mcg) of folic acid every day.  If you want to prevent pregnancy, talk to your health care provider about birth control (contraception). Osteoporosis and menopause  Osteoporosis is a disease in which the bones lose minerals and strength with aging. This can result in serious bone fractures. Your risk for osteoporosis can be identified using a bone density scan.  If you are 57 years of age or older, or if you are at risk for osteoporosis and fractures, ask your health care provider if you should be screened.  Ask your health care provider whether you should take a calcium or vitamin D supplement to lower your risk for osteoporosis.  Menopause may have certain physical symptoms and risks.  Hormone replacement therapy may reduce some of these symptoms and risks. Talk to your health care provider about whether hormone replacement therapy is right for you. Follow these instructions at home:  Schedule regular health, dental, and eye exams.  Stay current with your immunizations.  Do not use any tobacco products including cigarettes, chewing tobacco, or electronic cigarettes.  If you are pregnant, do not drink alcohol.  If you are breastfeeding, limit how much and how often you drink alcohol.  Limit alcohol intake to no more than 1 drink per day for nonpregnant women. One drink equals 12 ounces of beer, 5 ounces of wine, or 1 ounces of hard liquor.  Do not use street drugs.  Do not share needles.  Ask your health care  provider for help if you need support or information about quitting drugs.  Tell your health care provider if you often feel depressed.  Tell your health care provider if you have ever been abused or do not feel safe at home. This information is not intended to replace advice given to you by your health care provider. Make sure you discuss any questions you have with your health care provider. Document Released: 11/09/2010 Document Revised: 10/02/2015 Document Reviewed: 01/28/2015 Elsevier Interactive Patient Education  2018 [employer].   Preparing for Pregnancy If you are considering becoming pregnant, make an appointment to see your regular health care provider to learn how to prepare for a safe and healthy pregnancy (preconception care). During a preconception care visit, your health care provider will:  Do a complete physical exam, including a Pap test.  Take a complete medical history.  Give you information, answer your questions, and help you resolve problems.  Preconception checklist Medical history  Tell your health care provider about any current or past medical conditions. Your pregnancy or your ability to become pregnant may be affected by chronic conditions, such as diabetes, chronic hypertension, and thyroid problems.  Include your family's medical history as well as your partner's medical history.  Tell your health care provider about any history of STIs (sexually transmitted infections).These can affect your pregnancy. In some cases, they can be passed to your baby. Discuss any concerns that you have about STIs.  If indicated, discuss the benefits of genetic testing. This testing will show whether there are any genetic conditions that may be passed from you or your partner to your baby.  Tell your health care provider about: ? Any problems you have had with conception or pregnancy. ? Any medicines you take. These include vitamins, herbal supplements, and  over-the-counter medicines. ? Your history of immunizations. Discuss any vaccinations that you may need.  Diet  Ask your health care provider what to include in a healthy diet that has a  balance of nutrients. This is especially important when you are pregnant or preparing to become pregnant.  Ask your health care provider to help you reach a healthy weight before pregnancy. ? If you are overweight, you may be at higher risk for certain complications, such as high blood pressure, diabetes, and preterm birth. ? If you are underweight, you are more likely to have a baby who has a low birth weight.  Lifestyle, work, and home  Let your health care provider know: ? About any lifestyle habits that you have, such as alcohol use, drug use, or smoking. ? About recreational activities that may put you at risk during pregnancy, such as downhill skiing and certain exercise programs. ? Tell your health care provider about any international travel, especially any travel to places with an active [place] virus outbreak. ? About harmful substances that you may be exposed to at work or at home. These include chemicals, pesticides, radiation, or even litter boxes. ? If you do not feel safe at home.  Mental health  Tell your health care provider about: ? Any history of mental health conditions, including feelings of depression, sadness, or anxiety. ? Any medicines that you take for a mental health condition. These include herbs and supplements.  Home instructions to prepare for pregnancy Lifestyle  Eat a balanced diet. This includes fresh fruits and vegetables, whole grains, lean meats, low-fat dairy products, healthy fats, and foods that are high in fiber. Ask to meet with a nutritionist or registered dietitian for assistance with meal planning and goals.  Get regular exercise. Try to be active for at least 30 minutes a day on most days of the week. Ask your health care provider which activities are safe  during pregnancy.  Do not use any products that contain nicotine or tobacco, such as cigarettes and e-cigarettes. If you need help quitting, ask your health care provider.  Do not drink alcohol.  Do not take illegal drugs.  Maintain a healthy weight. Ask your health care provider what weight range is right for you.  General instructions  Keep an accurate record of your menstrual periods. This makes it easier for your health care provider to determine your baby's due date.  Begin taking prenatal vitamins and folic acid supplements daily as directed by your health care provider.  Manage any chronic conditions, such as high blood pressure and diabetes, as told by your health care provider. This is important.  How do I know that I am pregnant? You may be pregnant if you have been sexually active and you miss your period. Symptoms of early pregnancy include:  Mild cramping.  Very light vaginal bleeding (spotting).  Feeling unusually tired.  Nausea and vomiting (morning sickness).  If you have any of these symptoms and you suspect that you might be pregnant, you can take a home pregnancy test. These tests check for a hormone in your urine (human chorionic gonadotropin, or hCG). A woman's body begins to make this hormone during early pregnancy. These tests are very accurate. Wait until at least the first day after you miss your period to take one. If the test shows that you are pregnant (you get a positive result), call your health care provider to make an appointment for prenatal care. What should I do if I become pregnant?  Make an appointment with your health care provider as soon as you suspect you are pregnant.  Do not use any products that contain nicotine, such as cigarettes, chewing tobacco, and  e-cigarettes. If you need help quitting, ask your health care provider.  Do not drink alcoholic beverages. Alcohol is related to a number of birth defects.  Avoid toxic odors and  chemicals.  You may continue to have sexual intercourse if it does not cause pain or other problems, such as vaginal bleeding. This information is not intended to replace advice given to you by your health care provider. Make sure you discuss any questions you have with your health care provider. Document Released: 04/08/2008 Document Revised: 12/23/2015 Document Reviewed: 11/16/2015 Elsevier Interactive Patient Education  [employer].   Endometriosis Endometriosis is a condition in which the tissue that lines the uterus (endometrium) grows outside of its normal location. The tissue may grow in many locations close to the uterus, but it commonly grows on the ovaries, fallopian tubes, vagina, or bowel. When the uterus sheds the endometrium every menstrual cycle, there is bleeding wherever the endometrial tissue is located. This can cause pain because blood is irritating to tissues that are not normally exposed to it. What are the causes? The cause of endometriosis is not known. What increases the risk? You may be more likely to develop endometriosis if you:  Have a family history of endometriosis.  Have never given birth.  Started your period at age 75 or younger.  Have high levels of estrogen in your body.  Were exposed to a certain medicine (diethylstilbestrol) before you were born (in utero).  Had low birth weight.  Were born as a twin, triplet, or other multiple.  Have a BMI of less than 25. BMI is an estimate of body fat and is calculated from height and weight.  What are the signs or symptoms? Often, there are no symptoms of this condition. If you do have symptoms, they may:  Vary depending on where your endometrial tissue is growing.  Occur during your menstrual period (most common) or midcycle.  Come and go, or you may go months with no symptoms at all.  Stop with menopause.  Symptoms may include:  Pain in the back or abdomen.  Heavier bleeding during  periods.  Pain during sex.  Painful bowel movements.  Infertility.  Pelvic pain.  Bleeding more than once a month.  How is this diagnosed? This condition is diagnosed based on your symptoms and a physical exam. You may have tests, such as:  Blood tests and urine tests. These may be done to help rule out other possible causes of your symptoms.  Ultrasound, to look for abnormal tissues.  An X-ray of the lower bowel (barium enema).  An ultrasound that is done through the vagina (transvaginally).  CT scan.  MRI.  Laparoscopy. In this procedure, a lighted, pencil-sized instrument called a laparoscope is inserted into your abdomen through an incision. The laparoscope allows your health care provider to look at the organs inside your body and check for abnormal tissue to confirm the diagnosis. If abnormal tissue is found, your health care provider may remove a small piece of tissue (biopsy) to be examined under a microscope.  How is this treated? Treatment for this condition may include:  Medicines to relieve pain, such as NSAIDs.  Hormone therapy. This involves using artificial (synthetic) hormones to reduce endometrial tissue growth. Your health care provider may recommend using a hormonal form of birth control, or other medicines.  Surgery. This may be done to remove abnormal endometrial tissue. ? In some cases, tissue may be removed using a laparoscope and a laser (laparoscopic laser  treatment). ? In severe cases, surgery may be done to remove the fallopian tubes, uterus, and ovaries (hysterectomy).  Follow these instructions at home:  Take over-the-counter and prescription medicines only as told by your health care provider.  Do not drive or use heavy machinery while taking prescription pain medicine.  Try to avoid activities that cause pain, including sexual activity.  Keep all follow-up visits as told by your health care provider. This is important. Contact a health  care provider if:  You have pain in the area between your hip bones (pelvic area) that occurs: ? Before, during, or after your period. ? In between your period and gets worse during your period. ? During or after sex. ? With bowel movements or urination, especially during your period.  You have problems getting pregnant.  You have a fever. Get help right away if:  You have severe pain that does not get better with medicine.  You have severe nausea and vomiting, or you cannot eat without vomiting.  You have pain that affects only the lower, right side of your abdomen.  You have abdominal pain that gets worse.  You have abdominal swelling.  You have blood in your stool. This information is not intended to replace advice given to you by your health care provider. Make sure you discuss any questions you have with your health care provider. Document Released: 04/23/2000 Document Revised: 01/30/2016 Document Reviewed: 09/27/2015 Elsevier Interactive Patient Education  [employer].

## 2018-03-14 NOTE — Progress Notes (Signed)
GYNECOLOGY ANNUAL PHYSICAL EXAM PROGRESS NOTE  Subjective:    Kristi [name] is a 32 y.o. G19P1011 female with h/o hypothyroidism who presents for an annual exam. She usually sees Mel[facility] [place], [place], but noted that she had concerns regarding some pain she's been having and desired to see an MD. The patient is sexually active. The patient wears seatbelts: yes. The patient participates in regular exercise: no. Has the patient ever been transfused or tattooed?: no. The patient reports that there is not domestic violence in her life.   The patient has no complaints today:  1. Painful intercourse, painful menses for several years.  Has never been on any hormonal prevention (although was prescribed Nuvaring and OCPs in the past, she never initiated them). Currently uses Ibuprofen, heating pads for pain.  Cycles tend to be fairly heavy, but regular, lasting 5-6 days. Has diagnosis of endometriosis based on symptoms, but has never had confirmation with laparoscopy.   Gynecologic History Menarche age: 26 Patient's last menstrual period was 02/21/2018. Contraception: none History of STI's: Denies Last Pap: 02/23/2017. Results were: normal.  Denies h/o abnormal pap smears.   OB History  Gravida Para Term Preterm AB Living  2 0 0 0 1 0  SAB TAB Ectopic Multiple Live Births  1 0 0 0 0    # Outcome Date GA Lbr Len/2nd Weight Sex Delivery Anes PTL Lv  2 Gravida 05/15/14    M      1 SAB             Past Medical History:  Diagnosis Date  . Acid reflux   . Hypothyroidism   . Obesity   . Pap smear, low-risk 2011    History reviewed. No pertinent surgical history.  Family History  Problem Relation Age of Onset  . Hypothyroidism Mother   . Hypothyroidism Maternal Grandmother   . Heart disease Paternal Grandfather   . Hypothyroidism Father     Social History   Socioeconomic History  . Marital status: Married    Spouse name: Not on file  . Number of children: Not on file  .  Years of education: Not on file  . Highest education level: Not on file  Occupational History  . Not on file  Social Needs  . Financial resource strain: Not on file  . Food insecurity:    Worry: Not on file    Inability: Not on file  . Transportation needs:    Medical: Not on file    Non-medical: Not on file  Tobacco Use  . Smoking status: Never Smoker  . Smokeless tobacco: Never Used  Substance and Sexual Activity  . Alcohol use: No  . Drug use: No  . Sexual activity: Yes    Birth control/protection: None, Condom  Lifestyle  . Physical activity:    Days per week: Not on file    Minutes per session: Not on file  . Stress: Not on file  Relationships  . Social connections:    Talks on phone: Not on file    Gets together: Not on file    Attends religious service: Not on file    Active member of club or organization: Not on file    Attends meetings of clubs or organizations: Not on file    Relationship status: Not on file  . Intimate partner violence:    Fear of current or ex partner: Not on file    Emotionally abused: Not on file  Physically abused: Not on file    Forced sexual activity: Not on file  Other Topics Concern  . Not on file  Social History Narrative   [facility].    Current Outpatient Medications on File Prior to Visit  Medication Sig Dispense Refill  . levothyroxine (SYNTHROID, LEVOTHROID) 100 MCG tablet Take 100 mcg by mouth daily before breakfast.    . omeprazole (PRILOSEC) 20 MG capsule Take 20 mg by mouth daily.    . Vitamin D, Ergocalciferol, (DRISDOL) 50000 units CAPS capsule Take 1 capsule (50,000 Units total) by mouth every 7 (seven) days. 30 capsule 1   Current Facility-Administered Medications on File Prior to Visit  Medication Dose Route Frequency Provider Last Rate Last Dose  . cyanocobalamin ((VITAMIN B-12)) injection 1,000 mcg  1,000 mcg Intramuscular Once Shambley, Melody N, CNM        No Known Allergies    Review of  Systems Constitutional: negative for chills, fatigue, fevers and sweats. Weight loss (intentional, 20 lbs).  Eyes: negative for irritation, redness and visual disturbance Ears, nose, mouth, throat, and face: negative for hearing loss, nasal congestion, snoring and tinnitus Respiratory: negative for asthma, cough, sputum Cardiovascular: negative for chest pain, dyspnea, exertional chest pressure/discomfort, irregular heart beat, palpitations and syncope Gastrointestinal: negative for abdominal pain, change in bowel habits, nausea and vomiting Genitourinary: negative for abnormal menstrual periods, genital lesions, sexual problems and vaginal discharge, dysuria and urinary incontinence Integument/breast: negative for breast lump, breast tenderness and nipple discharge Hematologic/lymphatic: negative for bleeding and easy bruising Musculoskeletal:negative for back pain and muscle weakness Neurological: negative for dizziness, headaches, vertigo and weakness Endocrine: negative for diabetic symptoms including polydipsia, polyuria and skin dryness Allergic/Immunologic: negative for hay fever and urticaria        Objective:  Blood pressure 122/81, pulse 75, height 5\' 10"  (1.778 m), weight 234 lb 9.6 oz (106.4 kg), last menstrual period 02/21/2018. Body mass index is 33.66 kg/m.  General Appearance:    Alert, cooperative, no distress, appears stated age, mild obesity  Head:    Normocephalic, without obvious abnormality, atraumatic  Eyes:    PERRL, conjunctiva/corneas clear, EOM's intact, both eyes  Ears:    Normal external ear canals, both ears  Nose:   Nares normal, septum midline, mucosa normal, no drainage or sinus tenderness  Throat:   Lips, mucosa, and tongue normal; teeth and gums normal  Neck:   Supple, symmetrical, trachea midline, no adenopathy; thyroid: no enlargement/tenderness/nodules; no carotid bruit or JVD  Back:     Symmetric, no curvature, ROM normal, no CVA tenderness  Lungs:      Clear to auscultation bilaterally, respirations unlabored  Chest Wall:    No tenderness or deformity   Heart:    Regular rate and rhythm, S1 and S2 normal, no murmur, rub or gallop  Breast Exam:    No tenderness, masses, or nipple abnormality  Abdomen:     Soft, non-tender, bowel sounds active all four quadrants, no masses, no organomegaly.    Genitalia:    Pelvic:external genitalia normal, vagina without lesions, discharge, or tenderness, rectovaginal septum  normal. Cervix normal in appearance, no mild cervical motion tenderness present, no adnexal masses or tenderness.  Uterus normal size, shape, mobile, regular contours, nontender.  Rectal:    Normal external sphincter.  No hemorrhoids appreciated. Internal exam not done.   Extremities:   Extremities normal, atraumatic, no cyanosis or edema  Pulses:   2+ and symmetric all extremities  Skin:   Skin color, texture,  turgor normal, no rashes or lesions  Lymph nodes:   Cervical, supraclavicular, and axillary nodes normal  Neurologic:   CNII-XII intact, normal strength, sensation and reflexes throughout   .  Labs:  Reviewed in Care Everywhere (01/2018)  Assessment:    Healthy female exam.   Endometriosis  Mild obesity Hypothyroidism  Plan:     Blood tests: None ordered.  Reviewed in Care Everywhere.  Breast self exam technique reviewed and patient encouraged to perform self-exam monthly. Contraception: none.  Desires to attempt conception.  Advised on beginning a prenatal vitamin.    Discussed healthy lifestyle modifications. Is on Weight Watchers, losing weight (has lost 20 lbs in the past 2-3 months).  Pap smear up to date. Next due in 2021.   Discussed endometriosis with regards to infertility. Patient has been able to conceive successfully in the past (although one pregnancy resulted did result in a miscarriage).  Advised on timed coitus, limiting use of gels.  Also will prescribe Ibuprofen 800 mg and Lysteda to help with cycles,  as typically suppression therapy can't be used when attempting to conceive.  Sees endocrinologist for thyroid disorder (las seen 1-2 months ago).  Flu vaccine declined.  RTC in 1 year, or sooner as needed.    Hildred Laser, MD [facility]

## 2018-04-11 DIAGNOSIS — N390 Urinary tract infection, site not specified: Secondary | ICD-10-CM | POA: Diagnosis not present

## 2018-04-11 DIAGNOSIS — R319 Hematuria, unspecified: Secondary | ICD-10-CM | POA: Diagnosis not present

## 2018-05-23 DIAGNOSIS — Z0000 Encounter for general adult medical examination without abnormal findings: Secondary | ICD-10-CM | POA: Diagnosis not present

## 2018-05-23 DIAGNOSIS — E039 Hypothyroidism, unspecified: Secondary | ICD-10-CM | POA: Diagnosis not present

## 2018-05-23 DIAGNOSIS — E559 Vitamin D deficiency, unspecified: Secondary | ICD-10-CM | POA: Diagnosis not present

## 2018-05-23 DIAGNOSIS — E7849 Other hyperlipidemia: Secondary | ICD-10-CM | POA: Diagnosis not present

## 2018-06-27 DIAGNOSIS — E039 Hypothyroidism, unspecified: Secondary | ICD-10-CM | POA: Diagnosis not present

## 2018-09-29 ENCOUNTER — Ambulatory Visit (INDEPENDENT_AMBULATORY_CARE_PROVIDER_SITE_OTHER): Payer: BLUE CROSS/BLUE SHIELD | Admitting: Obstetrics and Gynecology

## 2018-09-29 ENCOUNTER — Other Ambulatory Visit: Payer: Self-pay

## 2018-09-29 ENCOUNTER — Encounter: Payer: Self-pay | Admitting: Obstetrics and Gynecology

## 2018-09-29 VITALS — BP 110/80 | HR 98 | Ht 70.0 in | Wt 250.0 lb

## 2018-09-29 DIAGNOSIS — Z7689 Persons encountering health services in other specified circumstances: Secondary | ICD-10-CM | POA: Diagnosis not present

## 2018-09-29 DIAGNOSIS — Z6835 Body mass index (BMI) 35.0-35.9, adult: Secondary | ICD-10-CM | POA: Diagnosis not present

## 2018-09-29 MED ORDER — PHENTERMINE HCL 37.5 MG PO TABS: 37.5000 mg | ORAL_TABLET | Freq: Every day | ORAL | 2 refills | Status: DC

## 2018-09-29 MED ORDER — CYANOCOBALAMIN 1000 MCG/ML IJ SOLN: 1000.0000 ug | INTRAMUSCULAR | 1 refills | Status: DC

## 2018-09-29 NOTE — Progress Notes (Signed)
Virtual Visit via Telephone Note  I connected with Kristi [name] on 09/29/18 at  3:15 PM EDT by telephone and verified that I am speaking with the correct person using two identifiers.  Location: Patient: home Provider: office   I discussed the limitations, risks, security and privacy concerns of performing an evaluation and management service by telephone and the availability of in person appointments. I also discussed with the patient that there may be a patient responsible charge related to this service. The patient expressed understanding and agreed to proceed.   History of Present Illness: Has had weight gain and desires restarting medicine for weight loss. Plans to walk 3-4 days a week. Used adipex in past and it worked well for her. Knows to increase water intake. States no new medications. Thyroid has been stable.   Observations/Objective: Well sounding female in no distress Patient stated weight today is 250 lbs Patient states BP at home last night was 110/80 Body mass index is 35.87 kg/m.  Assessment and Plan: BMI 35.8 Obesity Weight loss management  Follow Up Instructions: Will restart adipex with monthly B12 injections. Prescriptions sent in. Will have sister who is a Charity fundraiser give first injection. RTC in 4 weeks for weight check, or as needed.  Melody Shambley,CNM   I discussed the assessment and treatment plan with the patient. The patient was provided an opportunity to ask questions and all were answered. The patient agreed with the plan and demonstrated an understanding of the instructions.   The patient was advised to call back or seek an in-person evaluation if the symptoms worsen or if the condition fails to improve as anticipated.  I provided 10 minutes of non-face-to-face time during this encounter.   Melody Suzan Nailer, CNM

## 2018-10-24 ENCOUNTER — Encounter: Payer: Self-pay | Admitting: *Deleted

## 2018-10-24 ENCOUNTER — Telehealth: Payer: Self-pay | Admitting: Obstetrics and Gynecology

## 2018-10-24 NOTE — Telephone Encounter (Signed)
The patient called and stated that she wants a call back from Amy to ask if her appointment can be a tele-visit on Thursday. The patient stated that she going to be too busy at work and is unable to leave. Please advise.

## 2018-10-24 NOTE — Telephone Encounter (Signed)
Sent mcm-ac 

## 2018-10-25 DIAGNOSIS — D485 Neoplasm of uncertain behavior of skin: Secondary | ICD-10-CM | POA: Diagnosis not present

## 2018-10-26 ENCOUNTER — Encounter: Payer: BLUE CROSS/BLUE SHIELD | Admitting: Obstetrics and Gynecology

## 2018-10-26 ENCOUNTER — Encounter: Payer: Self-pay | Admitting: Obstetrics and Gynecology

## 2018-10-26 ENCOUNTER — Ambulatory Visit (INDEPENDENT_AMBULATORY_CARE_PROVIDER_SITE_OTHER): Payer: BC Managed Care – PPO | Admitting: Obstetrics and Gynecology

## 2018-10-26 ENCOUNTER — Other Ambulatory Visit: Payer: Self-pay

## 2018-10-26 VITALS — BP 128/78 | HR 98 | Ht 70.0 in | Wt 243.0 lb

## 2018-10-26 DIAGNOSIS — Z713 Dietary counseling and surveillance: Secondary | ICD-10-CM | POA: Diagnosis not present

## 2018-10-26 DIAGNOSIS — Z7689 Persons encountering health services in other specified circumstances: Secondary | ICD-10-CM

## 2018-10-26 DIAGNOSIS — Z6835 Body mass index (BMI) 35.0-35.9, adult: Secondary | ICD-10-CM

## 2018-10-26 MED ORDER — CYANOCOBALAMIN 1000 MCG/ML IJ SOLN
1000.0000 ug | Freq: Once | INTRAMUSCULAR | Status: AC
  Administered 2018-10-26: 1000 ug via INTRAMUSCULAR

## 2018-10-26 NOTE — Progress Notes (Signed)
Pt is here for wt, bp check, b-12 inj She denies any s/e   09/29/18 wt- 250.lb 10/26/18 wt-243lb  Waist 40 in

## 2018-11-09 DIAGNOSIS — E7849 Other hyperlipidemia: Secondary | ICD-10-CM | POA: Diagnosis not present

## 2018-11-09 DIAGNOSIS — E039 Hypothyroidism, unspecified: Secondary | ICD-10-CM | POA: Diagnosis not present

## 2018-11-16 DIAGNOSIS — E559 Vitamin D deficiency, unspecified: Secondary | ICD-10-CM | POA: Diagnosis not present

## 2018-11-16 DIAGNOSIS — E039 Hypothyroidism, unspecified: Secondary | ICD-10-CM | POA: Diagnosis not present

## 2018-11-16 DIAGNOSIS — E7849 Other hyperlipidemia: Secondary | ICD-10-CM | POA: Diagnosis not present

## 2018-11-16 DIAGNOSIS — Z1159 Encounter for screening for other viral diseases: Secondary | ICD-10-CM | POA: Diagnosis not present

## 2018-11-24 ENCOUNTER — Encounter: Payer: BC Managed Care – PPO | Admitting: Obstetrics and Gynecology

## 2018-12-06 DIAGNOSIS — Z1283 Encounter for screening for malignant neoplasm of skin: Secondary | ICD-10-CM | POA: Diagnosis not present

## 2018-12-06 DIAGNOSIS — D239 Other benign neoplasm of skin, unspecified: Secondary | ICD-10-CM | POA: Diagnosis not present

## 2018-12-06 DIAGNOSIS — L578 Other skin changes due to chronic exposure to nonionizing radiation: Secondary | ICD-10-CM | POA: Diagnosis not present

## 2018-12-22 DIAGNOSIS — R51 Headache: Secondary | ICD-10-CM | POA: Diagnosis not present

## 2018-12-22 DIAGNOSIS — E7849 Other hyperlipidemia: Secondary | ICD-10-CM | POA: Diagnosis not present

## 2018-12-22 DIAGNOSIS — E039 Hypothyroidism, unspecified: Secondary | ICD-10-CM | POA: Diagnosis not present

## 2018-12-22 DIAGNOSIS — M542 Cervicalgia: Secondary | ICD-10-CM | POA: Diagnosis not present

## 2018-12-25 ENCOUNTER — Other Ambulatory Visit: Payer: Self-pay | Admitting: Internal Medicine

## 2018-12-25 DIAGNOSIS — R519 Headache, unspecified: Secondary | ICD-10-CM

## 2018-12-26 ENCOUNTER — Encounter: Payer: Self-pay | Admitting: Obstetrics and Gynecology

## 2018-12-26 ENCOUNTER — Other Ambulatory Visit: Payer: Self-pay

## 2018-12-26 ENCOUNTER — Ambulatory Visit: Payer: BC Managed Care – PPO | Admitting: Obstetrics and Gynecology

## 2018-12-26 VITALS — BP 126/86 | HR 75 | Ht 70.0 in | Wt 248.6 lb

## 2018-12-26 DIAGNOSIS — N926 Irregular menstruation, unspecified: Secondary | ICD-10-CM | POA: Diagnosis not present

## 2018-12-26 DIAGNOSIS — Z6835 Body mass index (BMI) 35.0-35.9, adult: Secondary | ICD-10-CM | POA: Diagnosis not present

## 2018-12-26 DIAGNOSIS — E039 Hypothyroidism, unspecified: Secondary | ICD-10-CM

## 2018-12-26 LAB — POCT URINE PREGNANCY: Preg Test, Ur: POSITIVE — AB

## 2018-12-26 NOTE — Progress Notes (Signed)
  Subjective:     Patient ID: Kristi [name], caucasian female   DOB: 02/16/1986, 33 y.o.   MRN: [id number]  HPI Pt presents for confirmation of pregnancy. UPT +. Reports normal LMP 11/24/2018, giving [facility] & EGA [redacted]w[redacted]d. Happy about planned pregnancy. Now [id number]. Pt is hypothyroid with recent adjustment to Synthroid by PCP. PCP following for endocrine needs, per pt request. Discussed previous birth hx with episiotomy and 9lb 3oz baby, previous SAB @ 12gw, difficulty BF. PT desires to BF this pregnancy, review of classes available on [email or web address].   Review of Systems  Constitutional: Negative.   HENT: Negative.   Eyes: Negative.   Respiratory: Negative.   Endocrine: Negative.   Genitourinary: Negative.   Allergic/Immunologic: Negative.   Neurological: Negative.   All other systems reviewed and are negative.      Objective:   Physical Exam Vitals signs reviewed.  Constitutional:      Appearance: Normal appearance.  HENT:     Head: Normocephalic and atraumatic.  Neurological:     Mental Status: She is alert and oriented to person, place, and time. Mental status is at baseline.  Psychiatric:        Mood and Affect: Mood normal.        Behavior: Behavior normal.        Thought Content: Thought content normal.        Judgment: Judgment normal.   Blood pressure 126/86, pulse 75, height 5\' 10"  (1.778 m), weight 248 lb 9.6 oz (112.8 kg), last menstrual period 11/24/2018. Body mass index is 35.67 kg/m.     Assessment:     Missed Menses Hypothyroidism BMI 35    Plan:     Return in 3 weeks for viability [place], nurse intake, and labs Look at online birth and lactation education classes   Silvestre Mesi, SNM

## 2018-12-26 NOTE — Patient Instructions (Signed)
First Trimester of Pregnancy The first trimester of pregnancy is from week 1 until the end of week 13 (months 1 through 3). A week after a sperm fertilizes an egg, the egg will implant on the wall of the uterus. This embryo will begin to develop into a baby. Genes from you and your partner will form the baby. The female genes will determine whether the baby will be a boy or a girl. At 6-8 weeks, the eyes and face will be formed, and the heartbeat can be seen on ultrasound. At the end of 12 weeks, all the baby's organs will be formed. Now that you are pregnant, you will want to do everything you can to have a healthy baby. Two of the most important things are to get good prenatal care and to follow your health care provider's instructions. Prenatal care is all the medical care you receive before the baby's birth. This care will help prevent, find, and treat any problems during the pregnancy and childbirth. Body changes during your first trimester Your body goes through many changes during pregnancy. The changes vary from woman to woman.  You may gain or lose a couple of pounds at first.  You may feel sick to your stomach (nauseous) and you may throw up (vomit). If the vomiting is uncontrollable, call your health care provider.  You may tire easily.  You may develop headaches that can be relieved by medicines. All medicines should be approved by your health care provider.  You may urinate more often. Painful urination may mean you have a bladder infection.  You may develop heartburn as a result of your pregnancy.  You may develop constipation because certain hormones are causing the muscles that push stool through your intestines to slow down.  You may develop hemorrhoids or swollen veins (varicose veins).  Your breasts may begin to grow larger and become tender. Your nipples may stick out more, and the tissue that surrounds them (areola) may become darker.  Your gums may bleed and may be  sensitive to brushing and flossing.  Dark spots or blotches (chloasma, mask of pregnancy) may develop on your face. This will likely fade after the baby is born.  Your menstrual periods will stop.  You may have a loss of appetite.  You may develop cravings for certain kinds of food.  You may have changes in your emotions from day to day, such as being excited to be pregnant or being concerned that something may go wrong with the pregnancy and baby.  You may have more vivid and strange dreams.  You may have changes in your hair. These can include thickening of your hair, rapid growth, and changes in texture. Some women also have hair loss during or after pregnancy, or hair that feels dry or thin. Your hair will most likely return to normal after your baby is born. What to expect at prenatal visits During a routine prenatal visit:  You will be weighed to make sure you and the baby are growing normally.  Your blood pressure will be taken.  Your abdomen will be measured to track your baby's growth.  The fetal heartbeat will be listened to between weeks 10 and 14 of your pregnancy.  Test results from any previous visits will be discussed. Your health care provider may ask you:  How you are feeling.  If you are feeling the baby move.  If you have had any abnormal symptoms, such as leaking fluid, bleeding, severe headaches, or abdominal   cramping.  If you are using any tobacco products, including cigarettes, chewing tobacco, and electronic cigarettes.  If you have any questions. Other tests that may be performed during your first trimester include:  Blood tests to find your blood type and to check for the presence of any previous infections. The tests will also be used to check for low iron levels (anemia) and protein on red blood cells (Rh antibodies). Depending on your risk factors, or if you previously had diabetes during pregnancy, you may have tests to check for high blood sugar  that affects pregnant women (gestational diabetes).  Urine tests to check for infections, diabetes, or protein in the urine.  An ultrasound to confirm the proper growth and development of the baby.  Fetal screens for spinal cord problems (spina bifida) and Down syndrome.  HIV (human immunodeficiency virus) testing. Routine prenatal testing includes screening for HIV, unless you choose not to have this test.  You may need other tests to make sure you and the baby are doing well. Follow these instructions at home: Medicines  Follow your health care provider's instructions regarding medicine use. Specific medicines may be either safe or unsafe to take during pregnancy.  Take a prenatal vitamin that contains at least 600 micrograms (mcg) of folic acid.  If you develop constipation, try taking a stool softener if your health care provider approves. Eating and drinking   Eat a balanced diet that includes fresh fruits and vegetables, whole grains, good sources of protein such as meat, eggs, or tofu, and low-fat dairy. Your health care provider will help you determine the amount of weight gain that is right for you.  Avoid raw meat and uncooked cheese. These carry germs that can cause birth defects in the baby.  Eating four or five small meals rather than three large meals a day may help relieve nausea and vomiting. If you start to feel nauseous, eating a few soda crackers can be helpful. Drinking liquids between meals, instead of during meals, also seems to help ease nausea and vomiting.  Limit foods that are high in fat and processed sugars, such as fried and sweet foods.  To prevent constipation: ? Eat foods that are high in fiber, such as fresh fruits and vegetables, whole grains, and beans. ? Drink enough fluid to keep your urine clear or pale yellow. Activity  Exercise only as directed by your health care provider. Most women can continue their usual exercise routine during  pregnancy. Try to exercise for 30 minutes at least 5 days a week. Exercising will help you: ? Control your weight. ? Stay in shape. ? Be prepared for labor and delivery.  Experiencing pain or cramping in the lower abdomen or lower back is a good sign that you should stop exercising. Check with your health care provider before continuing with normal exercises.  Try to avoid standing for long periods of time. Move your legs often if you must stand in one place for a long time.  Avoid heavy lifting.  Wear low-heeled shoes and practice good posture.  You may continue to have sex unless your health care provider tells you not to. Relieving pain and discomfort  Wear a good support bra to relieve breast tenderness.  Take warm sitz baths to soothe any pain or discomfort caused by hemorrhoids. Use hemorrhoid cream if your health care provider approves.  Rest with your legs elevated if you have leg cramps or low back pain.  If you develop varicose veins in   your legs, wear support hose. Elevate your feet for 15 minutes, 3-4 times a day. Limit salt in your diet. Prenatal care  Schedule your prenatal visits by the twelfth week of pregnancy. They are usually scheduled monthly at first, then more often in the last 2 months before delivery.  Write down your questions. Take them to your prenatal visits.  Keep all your prenatal visits as told by your health care provider. This is important. Safety  Wear your seat belt at all times when driving.  Make a list of emergency phone numbers, including numbers for family, friends, the hospital, and police and fire departments. General instructions  Ask your health care provider for a referral to a local prenatal education class. Begin classes no later than the beginning of month 6 of your pregnancy.  Ask for help if you have counseling or nutritional needs during pregnancy. Your health care provider can offer advice or refer you to specialists for help  with various needs.  Do not use hot tubs, steam rooms, or saunas.  Do not douche or use tampons or scented sanitary pads.  Do not cross your legs for long periods of time.  Avoid cat litter boxes and soil used by cats. These carry germs that can cause birth defects in the baby and possibly loss of the fetus by miscarriage or stillbirth.  Avoid all smoking, herbs, alcohol, and medicines not prescribed by your health care provider. Chemicals in these products affect the formation and growth of the baby.  Do not use any products that contain nicotine or tobacco, such as cigarettes and e-cigarettes. If you need help quitting, ask your health care provider. You may receive counseling support and other resources to help you quit.  Schedule a dentist appointment. At home, brush your teeth with a soft toothbrush and be gentle when you floss. Contact a health care provider if:  You have dizziness.  You have mild pelvic cramps, pelvic pressure, or nagging pain in the abdominal area.  You have persistent nausea, vomiting, or diarrhea.  You have a bad smelling vaginal discharge.  You have pain when you urinate.  You notice increased swelling in your face, hands, legs, or ankles.  You are exposed to fifth disease or chickenpox.  You are exposed to German measles (rubella) and have never had it. Get help right away if:  You have a fever.  You are leaking fluid from your vagina.  You have spotting or bleeding from your vagina.  You have severe abdominal cramping or pain.  You have rapid weight gain or loss.  You vomit blood or material that looks like coffee grounds.  You develop a severe headache.  You have shortness of breath.  You have any kind of trauma, such as from a fall or a car accident. Summary  The first trimester of pregnancy is from week 1 until the end of week 13 (months 1 through 3).  Your body goes through many changes during pregnancy. The changes vary from  woman to woman.  You will have routine prenatal visits. During those visits, your health care provider will examine you, discuss any test results you may have, and talk with you about how you are feeling. This information is not intended to replace advice given to you by your health care provider. Make sure you discuss any questions you have with your health care provider. Document Released: 04/20/2001 Document Revised: 04/08/2017 Document Reviewed: 04/07/2016 Elsevier Patient Education  2020 [employer]  

## 2019-01-02 ENCOUNTER — Telehealth: Payer: Self-pay | Admitting: Obstetrics and Gynecology

## 2019-01-02 NOTE — Telephone Encounter (Signed)
Patient has a upcoming dentist appointment. She is going to need x rays for this appointment. They are going to try and schedule appointment when she is in 2nd trimester. Thanks

## 2019-01-10 ENCOUNTER — Other Ambulatory Visit: Payer: Self-pay | Admitting: Obstetrics and Gynecology

## 2019-01-10 MED ORDER — BONJESTA 20-20 MG PO TBCR: 2.0000 | EXTENDED_RELEASE_TABLET | Freq: Every day | ORAL | 2 refills | Status: DC

## 2019-01-10 MED ORDER — PROMETHAZINE HCL 25 MG PO TABS: 25.0000 mg | ORAL_TABLET | Freq: Four times a day (QID) | ORAL | 2 refills | Status: DC | PRN

## 2019-01-10 MED ORDER — ONDANSETRON 4 MG PO TBDP: 4.0000 mg | ORAL_TABLET | Freq: Four times a day (QID) | ORAL | 2 refills | Status: DC | PRN

## 2019-01-17 ENCOUNTER — Other Ambulatory Visit: Payer: BC Managed Care – PPO

## 2019-01-24 NOTE — Progress Notes (Signed)
      Kristi [name] presents for NOB nurse intake visit. Pregnancy confirmation done at [facility],12/26/18, with M. Gayla Medicus.  G3.  P1011.  LMP.  EDD 08/31/19.  [place] [redacted]w[redacted]d. Pregnancy education material explained and given. 0 cats in the home.  NOB labs ordered. BMI greater than 30. TSH/HbgA1c ordered. Sickle cell not ordered due to race. HIV and drug screen explained and ordered. Genetic screening discussed. Genetic testing; Ordered. Pt to discuss genetic testing with provider. PNV encouraged. Pt to follow up with provider in 4 weeks for NOB physical.  [employer] and FMLA form signed and reviewed. Pt stated understanding all information.

## 2019-01-24 NOTE — Patient Instructions (Signed)
WHAT OB PATIENTS CAN EXPECT   Confirmation of pregnancy and ultrasound ordered if medically indicated-[redacted] weeks gestation  New OB (NOB) intake with nurse and New OB (NOB) labs- [redacted] weeks gestation  New OB (NOB) physical examination with provider- 11/[redacted] weeks gestation  Flu vaccine-[redacted] weeks gestation  Anatomy scan-[redacted] weeks gestation  Glucose tolerance test, blood work to test for anemia, T-dap vaccine-[redacted] weeks gestation  Vaginal swabs/cultures-STD/Group B strep-[redacted] weeks gestation  Appointments every 4 weeks until 28 weeks  Every 2 weeks from 28 weeks until 36 weeks  Weekly visits from 36 weeks until delivery  Morning Sickness  Morning sickness is when you feel sick to your stomach (nauseous) during pregnancy. You may feel sick to your stomach and throw up (vomit). You may feel sick in the morning, but you can feel this way at any time of day. Some women feel very sick to their stomach and cannot stop throwing up (hyperemesis gravidarum). Follow these instructions at home: Medicines  Take over-the-counter and prescription medicines only as told by your doctor. Do not take any medicines until you talk with your doctor about them first.  Taking multivitamins before getting pregnant can stop or lessen the harshness of morning sickness. Eating and drinking  Eat dry toast or crackers before getting out of bed.  Eat 5 or 6 small meals a day.  Eat dry and bland foods like rice and baked potatoes.  Do not eat greasy, fatty, or spicy foods.  Have someone cook for you if the smell of food causes you to feel sick or throw up.  If you feel sick to your stomach after taking prenatal vitamins, take them at night or with a snack.  Eat protein when you need a snack. Nuts, yogurt, and cheese are good choices.  Drink fluids throughout the day.  Try ginger ale made with real ginger, ginger tea made from fresh grated ginger, or ginger candies. General instructions  Do not use any products  that have nicotine or tobacco in them, such as cigarettes and e-cigarettes. If you need help quitting, ask your doctor.  Use an air purifier to keep the air in your house free of smells.  Get lots of fresh air.  Try to avoid smells that make you feel sick.  Try: ? Wearing a bracelet that is used for seasickness (acupressure wristband). ? Going to a doctor who puts thin needles into certain body points (acupuncture) to improve how you feel. Contact a doctor if:  You need medicine to feel better.  You feel dizzy or light-headed.  You are losing weight. Get help right away if:  You feel very sick to your stomach and cannot stop throwing up.  You pass out (faint).  You have very bad pain in your belly. Summary  Morning sickness is when you feel sick to your stomach (nauseous) during pregnancy.  You may feel sick in the morning, but you can feel this way at any time of day.  Making some changes to what you eat may help your symptoms go away. This information is not intended to replace advice given to you by your health care provider. Make sure you discuss any questions you have with your health care provider. Document Released: 06/03/2004 Document Revised: 04/08/2017 Document Reviewed: 05/27/2016 Elsevier Patient Education  2020 [employer]. How a Baby Grows During Pregnancy  Pregnancy begins when a female's sperm enters a female's egg (fertilization). Fertilization usually happens in one of the tubes (fallopian tubes) that connect the  ovaries to the womb (uterus). The fertilized egg moves down the fallopian tube to the uterus. Once it reaches the uterus, it implants into the lining of the uterus and begins to grow. For the first 10 weeks, the fertilized egg is called an embryo. After 10 weeks, it is called a fetus. As the fetus continues to grow, it receives oxygen and nutrients through tissue (placenta) that grows to support the developing baby. The placenta is the life support  system for the baby. It provides oxygen and nutrition and removes waste. Learning as much as you can about your pregnancy and how your baby is developing can help you enjoy the experience. It can also make you aware of when there might be a problem and when to ask questions. How long does a typical pregnancy last? A pregnancy usually lasts 280 days, or about 40 weeks. Pregnancy is divided into three periods of growth, also called trimesters:  First trimester: 0-12 weeks.  Second trimester: 13-27 weeks.  Third trimester: 28-40 weeks. The day when your baby is ready to be born (full term) is your estimated date of delivery. How does my baby develop month by month? First month  The fertilized egg attaches to the inside of the uterus.  Some cells will form the placenta. Others will form the fetus.  The arms, legs, brain, spinal cord, lungs, and heart begin to develop.  At the end of the first month, the heart begins to beat. Second month  The bones, inner ear, eyelids, hands, and feet form.  The genitals develop.  By the end of 8 weeks, all major organs are developing. Third month  All of the internal organs are forming.  Teeth develop below the gums.  Bones and muscles begin to grow. The spine can flex.  The skin is transparent.  Fingernails and toenails begin to form.  Arms and legs continue to grow longer, and hands and feet develop.  The fetus is about 3 inches (7.6 cm) long. Fourth month  The placenta is completely formed.  The external sex organs, neck, outer ear, eyebrows, eyelids, and fingernails are formed.  The fetus can hear, swallow, and move its arms and legs.  The kidneys begin to produce urine.  The skin is covered with a white, waxy coating (vernix) and very fine hair (lanugo). Fifth month  The fetus moves around more and can be felt for the first time (quickening).  The fetus starts to sleep and wake up and may begin to suck its finger.  The  nails grow to the end of the fingers.  The organ in the digestive system that makes bile (gallbladder) functions and helps to digest nutrients.  If your baby is a girl, eggs are present in her ovaries. If your baby is a boy, testicles start to move down into his scrotum. Sixth month  The lungs are formed.  The eyes open. The brain continues to develop.  Your baby has fingerprints and toe prints. Your baby's hair grows thicker.  At the end of the second trimester, the fetus is about 9 inches (22.9 cm) long. Seventh month  The fetus kicks and stretches.  The eyes are developed enough to sense changes in light.  The hands can make a grasping motion.  The fetus responds to sound. Eighth month  All organs and body systems are fully developed and functioning.  Bones harden, and taste buds develop. The fetus may hiccup.  Certain areas of the brain are still developing. The  skull remains soft. Ninth month  The fetus gains about  lb (0.23 kg) each week.  The lungs are fully developed.  Patterns of sleep develop.  The fetus's head typically moves into a head-down position (vertex) in the uterus to prepare for birth.  The fetus weighs 6-9 lb (2.72-4.08 kg) and is 19-20 inches (48.26-50.8 cm) long. What can I do to have a healthy pregnancy and help my baby develop? General instructions  Take prenatal vitamins as directed by your health care provider. These include vitamins such as folic acid, iron, calcium, and vitamin D. They are important for healthy development.  Take medicines only as directed by your health care provider. Read labels and ask a pharmacist or your health care provider whether over-the-counter medicines, supplements, and prescription drugs are safe to take during pregnancy.  Keep all follow-up visits as directed by your health care provider. This is important. Follow-up visits include prenatal care and screening tests. How do I know if my baby is developing  well? At each prenatal visit, your health care provider will do several different tests to check on your health and keep track of your baby's development. These include:  Fundal height and position. ? Your health care provider will measure your growing belly from your pubic bone to the top of the uterus using a tape measure. ? Your health care provider will also feel your belly to determine your baby's position.  Heartbeat. ? An ultrasound in the first trimester can confirm pregnancy and show a heartbeat, depending on how far along you are. ? Your health care provider will check your baby's heart rate at every prenatal visit.  Second trimester ultrasound. ? This ultrasound checks your baby's development. It also may show your baby's gender. What should I do if I have concerns about my baby's development? Always talk with your health care provider about any concerns that you may have about your pregnancy and your baby. Summary  A pregnancy usually lasts 280 days, or about 40 weeks. Pregnancy is divided into three periods of growth, also called trimesters.  Your health care provider will monitor your baby's growth and development throughout your pregnancy.  Follow your health care provider's recommendations about taking prenatal vitamins and medicines during your pregnancy.  Talk with your health care provider if you have any concerns about your pregnancy or your developing baby. This information is not intended to replace advice given to you by your health care provider. Make sure you discuss any questions you have with your health care provider. Document Released: 10/13/2007 Document Revised: 08/17/2018 Document Reviewed: 03/09/2017 Elsevier Patient Education  2020 [place] of Pregnancy  The first trimester of pregnancy is from week 1 until the end of week 13 (months 1 through 3). During this time, your baby will begin to develop inside you. At 6-8 weeks, the eyes  and face are formed, and the heartbeat can be seen on ultrasound. At the end of 12 weeks, all the baby's organs are formed. Prenatal care is all the medical care you receive before the birth of your baby. Make sure you get good prenatal care and follow all of your doctor's instructions. Follow these instructions at home: Medicines  Take over-the-counter and prescription medicines only as told by your doctor. Some medicines are safe and some medicines are not safe during pregnancy.  Take a prenatal vitamin that contains at least 600 micrograms (mcg) of folic acid.  If you have trouble pooping (constipation), take medicine that  will make your stool soft (stool softener) if your doctor approves. Eating and drinking   Eat regular, healthy meals.  Your doctor will tell you the amount of weight gain that is right for you.  Avoid raw meat and uncooked cheese.  If you feel sick to your stomach (nauseous) or throw up (vomit): ? Eat 4 or 5 small meals a day instead of 3 large meals. ? Try eating a few soda crackers. ? Drink liquids between meals instead of during meals.  To prevent constipation: ? Eat foods that are high in fiber, like fresh fruits and vegetables, whole grains, and beans. ? Drink enough fluids to keep your pee (urine) clear or pale yellow. Activity  Exercise only as told by your doctor. Stop exercising if you have cramps or pain in your lower belly (abdomen) or low back.  Do not exercise if it is too hot, too humid, or if you are in a place of great height (high altitude).  Try to avoid standing for long periods of time. Move your legs often if you must stand in one place for a long time.  Avoid heavy lifting.  Wear low-heeled shoes. Sit and stand up straight.  You can have sex unless your doctor tells you not to. Relieving pain and discomfort  Wear a good support bra if your breasts are sore.  Take warm water baths (sitz baths) to soothe pain or discomfort caused by  hemorrhoids. Use hemorrhoid cream if your doctor says it is okay.  Rest with your legs raised if you have leg cramps or low back pain.  If you have puffy, bulging veins (varicose veins) in your legs: ? Wear support hose or compression stockings as told by your doctor. ? Raise (elevate) your feet for 15 minutes, 3-4 times a day. ? Limit salt in your food. Prenatal care  Schedule your prenatal visits by the twelfth week of pregnancy.  Write down your questions. Take them to your prenatal visits.  Keep all your prenatal visits as told by your doctor. This is important. Safety  Wear your seat belt at all times when driving.  Make a list of emergency phone numbers. The list should include numbers for family, friends, the hospital, and police and fire departments. General instructions  Ask your doctor for a referral to a local prenatal class. Begin classes no later than at the start of month 6 of your pregnancy.  Ask for help if you need counseling or if you need help with nutrition. Your doctor can give you advice or tell you where to go for help.  Do not use hot tubs, steam rooms, or saunas.  Do not douche or use tampons or scented sanitary pads.  Do not cross your legs for long periods of time.  Avoid all herbs and alcohol. Avoid drugs that are not approved by your doctor.  Do not use any tobacco products, including cigarettes, chewing tobacco, and electronic cigarettes. If you need help quitting, ask your doctor. You may get counseling or other support to help you quit.  Avoid cat litter boxes and soil used by cats. These carry germs that can cause birth defects in the baby and can cause a loss of your baby (miscarriage) or stillbirth.  Visit your dentist. At home, brush your teeth with a soft toothbrush. Be gentle when you floss. Contact a doctor if:  You are dizzy.  You have mild cramps or pressure in your lower belly.  You have a nagging pain  in your belly area.  You  continue to feel sick to your stomach, you throw up, or you have watery poop (diarrhea).  You have a bad smelling fluid coming from your vagina.  You have pain when you pee (urinate).  You have increased puffiness (swelling) in your face, hands, legs, or ankles. Get help right away if:  You have a fever.  You are leaking fluid from your vagina.  You have spotting or bleeding from your vagina.  You have very bad belly cramping or pain.  You gain or lose weight rapidly.  You throw up blood. It may look like coffee grounds.  You are around people who have [place] measles, fifth disease, or chickenpox.  You have a very bad headache.  You have shortness of breath.  You have any kind of trauma, such as from a fall or a car accident. Summary  The first trimester of pregnancy is from week 1 until the end of week 13 (months 1 through 3).  To take care of yourself and your unborn baby, you will need to eat healthy meals, take medicines only if your doctor tells you to do so, and do activities that are safe for you and your baby.  Keep all follow-up visits as told by your doctor. This is important as your doctor will have to ensure that your baby is healthy and growing well. This information is not intended to replace advice given to you by your health care provider. Make sure you discuss any questions you have with your health care provider. Document Released: 10/13/2007 Document Revised: 08/17/2018 Document Reviewed: 05/04/2016 Elsevier Patient Education  2020 [place] of Pregnancy  The first trimester of pregnancy is from week 1 until the end of week 13 (months 1 through 3). During this time, your baby will begin to develop inside you. At 6-8 weeks, the eyes and face are formed, and the heartbeat can be seen on ultrasound. At the end of 12 weeks, all the baby's organs are formed. Prenatal care is all the medical care you receive before the birth of your baby. Make  sure you get good prenatal care and follow all of your doctor's instructions. Follow these instructions at home: Medicines  Take over-the-counter and prescription medicines only as told by your doctor. Some medicines are safe and some medicines are not safe during pregnancy.  Take a prenatal vitamin that contains at least 600 micrograms (mcg) of folic acid.  If you have trouble pooping (constipation), take medicine that will make your stool soft (stool softener) if your doctor approves. Eating and drinking   Eat regular, healthy meals.  Your doctor will tell you the amount of weight gain that is right for you.  Avoid raw meat and uncooked cheese.  If you feel sick to your stomach (nauseous) or throw up (vomit): ? Eat 4 or 5 small meals a day instead of 3 large meals. ? Try eating a few soda crackers. ? Drink liquids between meals instead of during meals.  To prevent constipation: ? Eat foods that are high in fiber, like fresh fruits and vegetables, whole grains, and beans. ? Drink enough fluids to keep your pee (urine) clear or pale yellow. Activity  Exercise only as told by your doctor. Stop exercising if you have cramps or pain in your lower belly (abdomen) or low back.  Do not exercise if it is too hot, too humid, or if you are in a place of great height (high  altitude).  Try to avoid standing for long periods of time. Move your legs often if you must stand in one place for a long time.  Avoid heavy lifting.  Wear low-heeled shoes. Sit and stand up straight.  You can have sex unless your doctor tells you not to. Relieving pain and discomfort  Wear a good support bra if your breasts are sore.  Take warm water baths (sitz baths) to soothe pain or discomfort caused by hemorrhoids. Use hemorrhoid cream if your doctor says it is okay.  Rest with your legs raised if you have leg cramps or low back pain.  If you have puffy, bulging veins (varicose veins) in your legs: ?  Wear support hose or compression stockings as told by your doctor. ? Raise (elevate) your feet for 15 minutes, 3-4 times a day. ? Limit salt in your food. Prenatal care  Schedule your prenatal visits by the twelfth week of pregnancy.  Write down your questions. Take them to your prenatal visits.  Keep all your prenatal visits as told by your doctor. This is important. Safety  Wear your seat belt at all times when driving.  Make a list of emergency phone numbers. The list should include numbers for family, friends, the hospital, and police and fire departments. General instructions  Ask your doctor for a referral to a local prenatal class. Begin classes no later than at the start of month 6 of your pregnancy.  Ask for help if you need counseling or if you need help with nutrition. Your doctor can give you advice or tell you where to go for help.  Do not use hot tubs, steam rooms, or saunas.  Do not douche or use tampons or scented sanitary pads.  Do not cross your legs for long periods of time.  Avoid all herbs and alcohol. Avoid drugs that are not approved by your doctor.  Do not use any tobacco products, including cigarettes, chewing tobacco, and electronic cigarettes. If you need help quitting, ask your doctor. You may get counseling or other support to help you quit.  Avoid cat litter boxes and soil used by cats. These carry germs that can cause birth defects in the baby and can cause a loss of your baby (miscarriage) or stillbirth.  Visit your dentist. At home, brush your teeth with a soft toothbrush. Be gentle when you floss. Contact a doctor if:  You are dizzy.  You have mild cramps or pressure in your lower belly.  You have a nagging pain in your belly area.  You continue to feel sick to your stomach, you throw up, or you have watery poop (diarrhea).  You have a bad smelling fluid coming from your vagina.  You have pain when you pee (urinate).  You have  increased puffiness (swelling) in your face, hands, legs, or ankles. Get help right away if:  You have a fever.  You are leaking fluid from your vagina.  You have spotting or bleeding from your vagina.  You have very bad belly cramping or pain.  You gain or lose weight rapidly.  You throw up blood. It may look like coffee grounds.  You are around people who have [place] measles, fifth disease, or chickenpox.  You have a very bad headache.  You have shortness of breath.  You have any kind of trauma, such as from a fall or a car accident. Summary  The first trimester of pregnancy is from week 1 until the end of week  13 (months 1 through 3).  To take care of yourself and your unborn baby, you will need to eat healthy meals, take medicines only if your doctor tells you to do so, and do activities that are safe for you and your baby.  Keep all follow-up visits as told by your doctor. This is important as your doctor will have to ensure that your baby is healthy and growing well. This information is not intended to replace advice given to you by your health care provider. Make sure you discuss any questions you have with your health care provider. Document Released: 10/13/2007 Document Revised: 08/17/2018 Document Reviewed: 05/04/2016 Elsevier Patient Education  2020 [employer]. Commonly Asked Questions During Pregnancy  Cats: A parasite can be excreted in cat feces.  To avoid exposure you need to have another person empty the little box.  If you must empty the litter box you will need to wear gloves.  Wash your hands after handling your cat.  This parasite can also be found in raw or undercooked meat so this should also be avoided.  Colds, Sore Throats, Flu: Please check your medication sheet to see what you can take for symptoms.  If your symptoms are unrelieved by these medications please call the office.  Dental Work: Most any dental work [employer] recommends is permitted.   X-rays should only be taken during the first trimester if absolutely necessary.  Your abdomen should be shielded with a lead apron during all x-rays.  Please notify your provider prior to receiving any x-rays.  Novocaine is fine; gas is not recommended.  If your dentist requires a note from [place] prior to dental work please call the office and we will provide one for you.  Exercise: Exercise is an important part of staying healthy during your pregnancy.  You may continue most exercises you were accustomed to prior to pregnancy.  Later in your pregnancy you will most likely notice you have difficulty with activities requiring balance like riding a bicycle.  It is important that you listen to your body and avoid activities that put you at a higher risk of falling.  Adequate rest and staying well hydrated are a must!  If you have questions about the safety of specific activities ask your provider.    Exposure to Children with illness: Try to avoid obvious exposure; report any symptoms to [place] when noted,  If you have chicken pos, red measles or mumps, you should be immune to these diseases.   Please do not take any vaccines while pregnant unless you have checked with your OB provider.  Fetal Movement: After 28 weeks we recommend you do "kick counts" twice daily.  Lie or sit down in a calm quiet environment and count your baby movements "kicks".  You should feel your baby at least 10 times per hour.  If you have not felt 10 kicks within the first hour get up, walk around and have something sweet to eat or drink then repeat for an additional hour.  If count remains less than 10 per hour notify your provider.  Fumigating: Follow your pest control agent's advice as to how long to stay out of your home.  Ventilate the area well before re-entering.  Hemorrhoids:   Most over-the-counter preparations can be used during pregnancy.  Check your medication to see what is safe to use.  It is important to use a stool softener  or fiber in your diet and to drink lots of liquids.  If hemorrhoids seem to be getting worse please call the office.   Hot Tubs:  Hot tubs Jacuzzis and saunas are not recommended while pregnant.  These increase your internal body temperature and should be avoided.  Intercourse:  Sexual intercourse is safe during pregnancy as long as you are comfortable, unless otherwise advised by your provider.  Spotting may occur after intercourse; report any bright red bleeding that is heavier than spotting.  Labor:  If you know that you are in labor, please go to the hospital.  If you are unsure, please call the office and let us help you decide what to do.  Lifting, straining, etc:  If your job requires heavy lifting or straining please check with your provider for any limitations.  Generally, you should not lift items heavier than that you can lift simply with your hands and arms (no back muscles)  Painting:  Paint fumes do not harm your pregnancy, but may make you ill and should be avoided if possible.  Latex or water based paints have less odor than oils.  Use adequate ventilation while painting.  Permanents & Hair Color:  Chemicals in hair dyes are not recommended as they cause increase hair dryness which can increase hair loss during pregnancy.  " Highlighting" and permanents are allowed.  Dye may be absorbed differently and permanents may not hold as well during pregnancy.  Sunbathing:  Use a sunscreen, as skin burns easily during pregnancy.  Drink plenty of fluids; avoid over heating.  Tanning Beds:  Because their possible side effects are still unknown, tanning beds are not recommended.  Ultrasound Scans:  Routine ultrasounds are performed at approximately 20 weeks.  You will be able to see your baby's general anatomy an if you would like to know the gender this can usually be determined as well.  If it is questionable when you conceived you may also receive an ultrasound early in your pregnancy for  dating purposes.  Otherwise ultrasound exams are not routinely performed unless there is a medical necessity.  Although you can request a scan we ask that you pay for it when conducted because insurance does not cover " patient request" scans.  Work: If your pregnancy proceeds without complications you may work until your due date, unless your physician or employer advises otherwise.  Round Ligament Pain/Pelvic Discomfort:  Sharp, shooting pains not associated with bleeding are fairly common, usually occurring in the second trimester of pregnancy.  They tend to be worse when standing up or when you remain standing for long periods of time.  These are the result of pressure of certain pelvic ligaments called "round ligaments".  Rest, Tylenol and heat seem to be the most effective relief.  As the womb and fetus grow, they rise out of the pelvis and the discomfort improves.  Please notify the office if your pain seems different than that described.  It may represent a more serious condition.  Common Medications Safe in Pregnancy  Acne:      Constipation:  Benzoyl Peroxide     Colace  Clindamycin      Dulcolax Suppository  Topica Erythromycin     Fibercon  Salicylic Acid      Metamucil         Miralax AVOID:        Senakot   Accutane    Cough:  Retin-A       Cough Drops  Tetracycline      Phenergan w/ Codeine if Rx  Minocycline  Robitussin (Plain & DM)  Antibiotics:     Crabs/Lice:  Ceclor       RID  Cephalosporins    AVOID:  E-Mycins      Kwell  Keflex  Macrobid/Macrodantin   Diarrhea:  Penicillin      Kao-Pectate  Zithromax      Imodium AD         PUSH FLUIDS AVOID:       Cipro     Fever:  Tetracycline      Tylenol (Regular or Extra  Minocycline       Strength)  Levaquin      Extra Strength-Do not          Exceed 8 tabs/24 hrs Caffeine:        '200mg'$ /day (equiv. To 1 cup of coffee or  approx. 3 12 oz sodas)         Gas: Cold/Hayfever:       Gas-X  Benadryl      Mylicon   Claritin       Phazyme  **Claritin-D        Chlor-Trimeton    Headaches:  Dimetapp      ASA-Free Excedrin  Drixoral-Non-Drowsy     Cold Compress  Mucinex (Guaifenasin)     Tylenol (Regular or Extra  Sudafed/Sudafed-12 Hour     Strength)  **Sudafed PE Pseudoephedrine   Tylenol Cold & Sinus     Vicks Vapor Rub  Zyrtec  **AVOID if Problems With Blood Pressure         Heartburn: Avoid lying down for at least 1 hour after meals  Aciphex      Maalox     Rash:  Milk of Magnesia     Benadryl    Mylanta       1% Hydrocortisone Cream  Pepcid  Pepcid Complete   Sleep Aids:  Prevacid      Ambien   Prilosec       Benadryl  Rolaids       Chamomile Tea  Tums (Limit 4/day)     Unisom  Zantac       Tylenol PM         Warm milk-add vanilla or  Hemorrhoids:       Sugar for taste  Anusol/Anusol H.C.  (RX: Analapram 2.5%)  Sugar Substitutes:  Hydrocortisone OTC     Ok in moderation  Preparation H      Tucks        Vaseline lotion applied to tissue with wiping    Herpes:     Throat:  Acyclovir      Oragel  Famvir  Valtrex     Vaccines:         Flu Shot Leg Cramps:       *Gardasil  Benadryl      Hepatitis A         Hepatitis B Nasal Spray:       Pneumovax  Saline Nasal Spray     Polio Booster         Tetanus Nausea:       Tuberculosis test or PPD  Vitamin B6 25 mg TID   AVOID:    Dramamine      *Gardasil  Emetrol       Live Poliovirus  Ginger Root 250 mg QID    MMR (measles, mumps &  High Complex Carbs @ Bedtime    rebella)  Sea Bands-Accupressure    Varicella (Chickenpox)  Unisom 1/2 tab  TID     *No known complications           If received before Pain:         Known pregnancy;   Darvocet       Resume series after  Lortab        Delivery  Percocet    Yeast:   Tramadol      Femstat  Tylenol 3      Gyne-lotrimin  Ultram       Monistat  Vicodin           MISC:         All Sunscreens           Hair Coloring/highlights          Insect Repellant's          (Including  DEET)         Mystic Tans

## 2019-01-25 ENCOUNTER — Other Ambulatory Visit: Payer: Self-pay

## 2019-01-25 ENCOUNTER — Ambulatory Visit (INDEPENDENT_AMBULATORY_CARE_PROVIDER_SITE_OTHER): Payer: BC Managed Care – PPO

## 2019-01-25 ENCOUNTER — Ambulatory Visit (INDEPENDENT_AMBULATORY_CARE_PROVIDER_SITE_OTHER): Payer: BC Managed Care – PPO | Admitting: Obstetrics and Gynecology

## 2019-01-25 ENCOUNTER — Other Ambulatory Visit: Payer: Self-pay | Admitting: Obstetrics and Gynecology

## 2019-01-25 VITALS — BP 120/77 | HR 88 | Ht 70.0 in | Wt 248.6 lb

## 2019-01-25 DIAGNOSIS — Z0189 Encounter for other specified special examinations: Secondary | ICD-10-CM

## 2019-01-25 DIAGNOSIS — R638 Other symptoms and signs concerning food and fluid intake: Secondary | ICD-10-CM

## 2019-01-25 DIAGNOSIS — Z0283 Encounter for blood-alcohol and blood-drug test: Secondary | ICD-10-CM

## 2019-01-25 DIAGNOSIS — N8312 Corpus luteum cyst of left ovary: Secondary | ICD-10-CM

## 2019-01-25 DIAGNOSIS — Z3A09 9 weeks gestation of pregnancy: Secondary | ICD-10-CM | POA: Diagnosis not present

## 2019-01-25 DIAGNOSIS — Z3481 Encounter for supervision of other normal pregnancy, first trimester: Secondary | ICD-10-CM

## 2019-01-25 DIAGNOSIS — Z113 Encounter for screening for infections with a predominantly sexual mode of transmission: Secondary | ICD-10-CM | POA: Diagnosis not present

## 2019-01-25 DIAGNOSIS — O3481 Maternal care for other abnormalities of pelvic organs, first trimester: Secondary | ICD-10-CM

## 2019-01-26 LAB — URINALYSIS, ROUTINE W REFLEX MICROSCOPIC
Bilirubin, UA: NEGATIVE
Glucose, UA: NEGATIVE
Ketones, UA: NEGATIVE
Nitrite, UA: NEGATIVE
Protein,UA: NEGATIVE
RBC, UA: NEGATIVE
Specific Gravity, UA: 1.024 (ref 1.005–1.030)
Urobilinogen, Ur: 0.2 mg/dL (ref 0.2–1.0)
pH, UA: 5.5 (ref 5.0–7.5)

## 2019-01-26 LAB — MICROSCOPIC EXAMINATION: Casts: NONE SEEN /lpf

## 2019-01-26 LAB — DRUG PROFILE, UR, 9 DRUGS (LABCORP)
Amphetamines, Urine: NEGATIVE ng/mL
Barbiturate Quant, Ur: NEGATIVE ng/mL
Benzodiazepine Quant, Ur: NEGATIVE ng/mL
Cannabinoid Quant, Ur: NEGATIVE ng/mL
Cocaine (Metab.): NEGATIVE ng/mL
Methadone Screen, Urine: NEGATIVE ng/mL
Opiate Quant, Ur: NEGATIVE ng/mL
PCP Quant, Ur: NEGATIVE ng/mL
Propoxyphene: NEGATIVE ng/mL

## 2019-01-26 LAB — HEPATITIS B SURFACE ANTIGEN: Hepatitis B Surface Ag: NEGATIVE

## 2019-01-26 LAB — HIV ANTIBODY (ROUTINE TESTING W REFLEX): HIV Screen 4th Generation wRfx: NONREACTIVE

## 2019-01-26 LAB — TOXOPLASMA ANTIBODIES- IGG AND  IGM
Toxoplasma Antibody- IgM: <3 AU/mL (ref 0.0–7.9)
Toxoplasma IgG Ratio: <3 IU/mL (ref 0.0–7.1)

## 2019-01-26 LAB — HEMOGLOBIN A1C
Est. average glucose Bld gHb Est-mCnc: 97 mg/dL
Hgb A1c MFr Bld: 5 % (ref 4.8–5.6)

## 2019-01-26 LAB — RUBELLA SCREEN: Rubella Antibodies, IGG: 1.6 index (ref >0.99)

## 2019-01-26 LAB — NICOTINE SCREEN, URINE: Cotinine Ql Scrn, Ur: NEGATIVE ng/mL

## 2019-01-26 LAB — RPR: RPR Ser Ql: NONREACTIVE

## 2019-01-26 LAB — TSH: TSH: 3.94 u[IU]/mL (ref 0.450–4.500)

## 2019-01-26 LAB — VARICELLA ZOSTER ANTIBODY, IGG: Varicella zoster IgG: 138 index — ABNORMAL LOW (ref >165)

## 2019-01-26 LAB — ABO AND RH: Rh Factor: POSITIVE

## 2019-01-27 LAB — URINE CULTURE, OB REFLEX

## 2019-01-27 LAB — GC/CHLAMYDIA PROBE AMP
Chlamydia trachomatis, NAA: NEGATIVE
Neisseria Gonorrhoeae by PCR: NEGATIVE

## 2019-01-27 LAB — CULTURE, OB URINE

## 2019-02-20 ENCOUNTER — Other Ambulatory Visit: Payer: Self-pay

## 2019-02-20 ENCOUNTER — Ambulatory Visit (INDEPENDENT_AMBULATORY_CARE_PROVIDER_SITE_OTHER): Payer: BC Managed Care – PPO | Admitting: Obstetrics and Gynecology

## 2019-02-20 ENCOUNTER — Encounter: Payer: Self-pay | Admitting: Obstetrics and Gynecology

## 2019-02-20 VITALS — BP 101/67 | HR 83 | Wt 253.7 lb

## 2019-02-20 DIAGNOSIS — Z6835 Body mass index (BMI) 35.0-35.9, adult: Secondary | ICD-10-CM

## 2019-02-20 DIAGNOSIS — Z2839 Other underimmunization status: Secondary | ICD-10-CM

## 2019-02-20 DIAGNOSIS — Z3A12 12 weeks gestation of pregnancy: Secondary | ICD-10-CM

## 2019-02-20 DIAGNOSIS — Z3481 Encounter for supervision of other normal pregnancy, first trimester: Secondary | ICD-10-CM

## 2019-02-20 DIAGNOSIS — E039 Hypothyroidism, unspecified: Secondary | ICD-10-CM | POA: Diagnosis not present

## 2019-02-20 DIAGNOSIS — Z283 Underimmunization status: Secondary | ICD-10-CM

## 2019-02-20 DIAGNOSIS — Z23 Encounter for immunization: Secondary | ICD-10-CM | POA: Diagnosis not present

## 2019-02-20 DIAGNOSIS — O99281 Endocrine, nutritional and metabolic diseases complicating pregnancy, first trimester: Secondary | ICD-10-CM

## 2019-02-20 DIAGNOSIS — Z31430 Encounter of female for testing for genetic disease carrier status for procreative management: Secondary | ICD-10-CM | POA: Diagnosis not present

## 2019-02-20 DIAGNOSIS — O09891 Supervision of other high risk pregnancies, first trimester: Secondary | ICD-10-CM

## 2019-02-20 DIAGNOSIS — O09899 Supervision of other high risk pregnancies, unspecified trimester: Secondary | ICD-10-CM | POA: Insufficient documentation

## 2019-02-20 LAB — POCT URINALYSIS DIPSTICK OB
Bilirubin, UA: NEGATIVE
Blood, UA: NEGATIVE
Glucose, UA: NEGATIVE
Ketones, UA: NEGATIVE
Leukocytes, UA: NEGATIVE
Nitrite, UA: NEGATIVE
Spec Grav, UA: 1.01 (ref 1.010–1.025)
Urobilinogen, UA: 0.2 E.U./dL
pH, UA: 6.5 (ref 5.0–8.0)

## 2019-02-20 NOTE — Progress Notes (Signed)
NEW OB HISTORY AND PHYSICAL  SUBJECTIVE:       Kristi [name] is a 33 y.o. G12P1011 female, Patient's last menstrual period was 11/24/2018., Estimated Date of Delivery: 08/31/19, [redacted]w[redacted]d, presents today for establishment of Prenatal Care. She has no unusual complaints and complains of nausea- has improved over last week, some headaches.      Gynecologic History Patient's last menstrual period was 11/24/2018. Normal Contraception: none Last Pap: 02/2017. Results were: normal  Obstetric History OB History  Gravida Para Term Preterm AB Living  3 1 1   1 1   SAB TAB Ectopic Multiple Live Births  1       1    # Outcome Date GA Lbr Len/2nd Weight Sex Delivery Anes PTL Lv  3 Current           2 Term 05/15/14    M Vag-Spont   LIV  1 SAB 2015            Past Medical History:  Diagnosis Date  . Acid reflux   . Hypothyroidism   . Obesity   . Pap smear, low-risk 2011    History reviewed. No pertinent surgical history.  Current Outpatient Medications on File Prior to Visit  Medication Sig Dispense Refill  . Doxylamine-Pyridoxine ER (BONJESTA) 20-20 MG TBCR Take 2 tablets by mouth at bedtime. 60 tablet 2  . levothyroxine (SYNTHROID) 137 MCG tablet Take 137 mcg by mouth daily before breakfast.    . ondansetron (ZOFRAN ODT) 4 MG disintegrating tablet Take 1 tablet (4 mg total) by mouth every 6 (six) hours as needed for nausea. 20 tablet 2  . Prenatal Vit-Fe Fumarate-FA (PRENATAL MULTIVITAMIN) TABS tablet Take 1 tablet by mouth daily at 12 noon.    . promethazine (PHENERGAN) 25 MG tablet Take 1 tablet (25 mg total) by mouth every 6 (six) hours as needed for nausea or vomiting. 30 tablet 2  . Vitamin D, Ergocalciferol, (DRISDOL) 50000 units CAPS capsule Take 1 capsule (50,000 Units total) by mouth every 7 (seven) days. 30 capsule 1   Current Facility-Administered Medications on File Prior to Visit  Medication Dose Route Frequency Provider Last Rate Last Dose  . cyanocobalamin ((VITAMIN  B-12)) injection 1,000 mcg  1,000 mcg Intramuscular Once Adelis Docter N, CNM        No Known Allergies  Social History   Socioeconomic History  . Marital status: Married    Spouse name: Not on file  . Number of children: Not on file  . Years of education: Not on file  . Highest education level: Not on file  Occupational History  . Not on file  Social Needs  . Financial resource strain: Not on file  . Food insecurity    Worry: Not on file    Inability: Not on file  . Transportation needs    Medical: Not on file    Non-medical: Not on file  Tobacco Use  . Smoking status: Never Smoker  . Smokeless tobacco: Never Used  Substance and Sexual Activity  . Alcohol use: No  . Drug use: No  . Sexual activity: Yes    Birth control/protection: None  Lifestyle  . Physical activity    Days per week: Not on file    Minutes per session: Not on file  . Stress: Not on file  Relationships  . Social 2012 on phone: Not on file    Gets together: Not on file    Attends religious service:  Not on file    Active member of club or organization: Not on file    Attends meetings of clubs or organizations: Not on file    Relationship status: Not on file  . Intimate partner violence    Fear of current or ex partner: Not on file    Emotionally abused: Not on file    Physically abused: Not on file    Forced sexual activity: Not on file  Other Topics Concern  . Not on file  Social History Narrative   [facility] imaging.    Family History  Problem Relation Age of Onset  . Hypothyroidism Mother   . Hypothyroidism Maternal Grandmother   . Heart disease Paternal Grandfather   . Hypothyroidism Father     The following portions of the patient's history were reviewed and updated as appropriate: allergies, current medications, past OB history, past medical history, past surgical history, past family history, past social history, and problem list.    OBJECTIVE: Initial Physical  Exam (New OB)  GENERAL APPEARANCE: alert, well appearing, in no apparent distress, oriented to person, place and time HEAD: normocephalic, atraumatic MOUTH: mucous membranes moist, pharynx normal without lesions and dental hygiene good THYROID: no thyromegaly or masses present BREASTS: not examined LUNGS: clear to auscultation, no wheezes, rales or rhonchi, symmetric air entry HEART: regular rate and rhythm, no murmurs ABDOMEN: soft, nontender, nondistended, no abnormal masses, no epigastric pain, obese, fundus not palpable and FHT present EXTREMITIES: no redness or tenderness in the calves or thighs SKIN: normal coloration and turgor, no rashes LYMPH NODES: no adenopathy palpable NEUROLOGIC: alert, oriented, normal speech, no focal findings or movement disorder noted  PELVIC EXAM declined  ASSESSMENT: Normal pregnancy BMI 35 Desires flu vaccine Desires genetic screening hypothyroidism   PLAN: Prenatal care Early glucola Flu vaccine given Genetic screening done today Discussed late growth scan and developing plan of delivery at that time.  See orders

## 2019-02-20 NOTE — Patient Instructions (Signed)

## 2019-02-20 NOTE — Progress Notes (Signed)
Patient comes in today for new ob physical. She has no concerns today. Her nausea and breast tenderness is getting better. She would like to have genetic testing done today.

## 2019-02-21 LAB — CBC
Hematocrit: 36.2 % (ref 34.0–46.6)
Hemoglobin: 11.8 g/dL (ref 11.1–15.9)
MCH: 28.7 pg (ref 26.6–33.0)
MCHC: 32.6 g/dL (ref 31.5–35.7)
MCV: 88 fL (ref 79–97)
Platelets: 265 10*3/uL (ref 150–450)
RBC: 4.11 x10E6/uL (ref 3.77–5.28)
RDW: 13.1 % (ref 11.7–15.4)
WBC: 8 10*3/uL (ref 3.4–10.8)

## 2019-02-21 LAB — TSH: TSH: 3.26 u[IU]/mL (ref 0.450–4.500)

## 2019-02-26 ENCOUNTER — Telehealth: Payer: Self-pay | Admitting: Obstetrics and Gynecology

## 2019-02-26 NOTE — Telephone Encounter (Signed)
Patient called stating she has a sinus infection. She is experiencing sore sinuses and pressure, headache above the eyes, green snot. She does not have a fever. She asked if something could be sent in to her pharmacy if possible, she uses the [facility] on [address]. Thanks

## 2019-02-26 NOTE — Telephone Encounter (Signed)
This is melody's pt can you please send this to her.   Thanks  Deneise Lever

## 2019-02-27 ENCOUNTER — Telehealth: Payer: Self-pay | Admitting: Obstetrics and Gynecology

## 2019-02-27 ENCOUNTER — Other Ambulatory Visit: Payer: Self-pay | Admitting: Obstetrics and Gynecology

## 2019-02-27 ENCOUNTER — Telehealth: Payer: Self-pay

## 2019-02-27 MED ORDER — CEFDINIR 300 MG PO CAPS: 300.0000 mg | ORAL_CAPSULE | Freq: Two times a day (BID) | ORAL | 0 refills | Status: DC

## 2019-02-27 NOTE — Telephone Encounter (Signed)
Please let her know I sent in an antibiotic today. Also she needs to add sudafed 12 hour OTC for the next 3 days.

## 2019-02-27 NOTE — Telephone Encounter (Signed)
mychart message sent. Antibiotic sent to pharmacy and add 12 hour sudafed.

## 2019-02-27 NOTE — Telephone Encounter (Signed)
The patient called and stated that she is feeling worse today and is wanting to know if she can have an antibiotic called in and also if she needs to schedule an appointment to see melody. Pt stated she did not receive a call back yesterday and is requesting one if possible today. Please advise.

## 2019-03-02 ENCOUNTER — Other Ambulatory Visit: Payer: BC Managed Care – PPO

## 2019-03-02 ENCOUNTER — Other Ambulatory Visit: Payer: Self-pay

## 2019-03-02 ENCOUNTER — Telehealth: Payer: Self-pay | Admitting: Certified Nurse Midwife

## 2019-03-02 DIAGNOSIS — Z3482 Encounter for supervision of other normal pregnancy, second trimester: Secondary | ICD-10-CM | POA: Diagnosis not present

## 2019-03-02 NOTE — Telephone Encounter (Signed)
Pt was contacted by Zadie Rhine and informed that she will have to do a re-draw. Pt called to reschedule genetic screening. Pt coming in office today okay per [facility]. Please advise.

## 2019-03-05 ENCOUNTER — Other Ambulatory Visit: Payer: BC Managed Care – PPO

## 2019-03-13 ENCOUNTER — Other Ambulatory Visit: Payer: Self-pay | Admitting: Obstetrics and Gynecology

## 2019-03-13 MED ORDER — TERCONAZOLE 0.4 % VA CREA: 1.0000 | TOPICAL_CREAM | Freq: Every day | VAGINAL | 0 refills | Status: DC

## 2019-03-14 ENCOUNTER — Telehealth: Payer: Self-pay

## 2019-03-14 NOTE — Telephone Encounter (Signed)
Faxed received from [place]- re: Horizon testing. Patient had Panorama/Horizon done however for the Panorama there was not enough fetal DNA. A redraw was needed. Patient preferred to come back to this office for a redraw instead of waiting for the [employer] rep to get in contact with her. The Horizon was drawn again however it was not necessary. Spoke with Frankey Poot at [place] and requested [employer] be cancelled. Also requested results of Panorama be faxed to our office. Results received and given to MS for review.

## 2019-03-20 ENCOUNTER — Other Ambulatory Visit: Payer: BC Managed Care – PPO

## 2019-03-20 ENCOUNTER — Encounter: Payer: BC Managed Care – PPO | Admitting: Certified Nurse Midwife

## 2019-03-20 ENCOUNTER — Encounter: Payer: Self-pay | Admitting: Certified Nurse Midwife

## 2019-03-20 ENCOUNTER — Other Ambulatory Visit: Payer: Self-pay

## 2019-03-20 ENCOUNTER — Ambulatory Visit (INDEPENDENT_AMBULATORY_CARE_PROVIDER_SITE_OTHER): Payer: BC Managed Care – PPO | Admitting: Certified Nurse Midwife

## 2019-03-20 VITALS — BP 100/63 | HR 79 | Wt 258.6 lb

## 2019-03-20 DIAGNOSIS — Z3481 Encounter for supervision of other normal pregnancy, first trimester: Secondary | ICD-10-CM

## 2019-03-20 LAB — POCT URINALYSIS DIPSTICK OB
Bilirubin, UA: NEGATIVE
Blood, UA: NEGATIVE
Glucose, UA: NEGATIVE
Ketones, UA: NEGATIVE
Leukocytes, UA: NEGATIVE
Nitrite, UA: NEGATIVE
POC,PROTEIN,UA: NEGATIVE
Spec Grav, UA: 1.01 (ref 1.010–1.025)
Urobilinogen, UA: 0.2 E.U./dL
pH, UA: 6 (ref 5.0–8.0)

## 2019-03-20 NOTE — Progress Notes (Signed)
Body mass index is 37.1 kg/m. [name] doing well. Early glucose today. Discussed round ligament pain and musculoskeletal discomforts of pregnancy. Genetic screening results reviwed-low risk result ,female and carrier screening test reviewed-negative.  U/s next appointment. Follow up 4 wks.   Philip Aspen, CNM

## 2019-03-20 NOTE — Patient Instructions (Signed)

## 2019-03-21 LAB — GLUCOSE, 1 HOUR GESTATIONAL: Gestational Diabetes Screen: 114 mg/dL (ref 65–139)

## 2019-04-17 ENCOUNTER — Ambulatory Visit (INDEPENDENT_AMBULATORY_CARE_PROVIDER_SITE_OTHER): Payer: BC Managed Care – PPO | Admitting: Certified Nurse Midwife

## 2019-04-17 ENCOUNTER — Ambulatory Visit (INDEPENDENT_AMBULATORY_CARE_PROVIDER_SITE_OTHER): Payer: BC Managed Care – PPO

## 2019-04-17 ENCOUNTER — Encounter: Payer: Self-pay | Admitting: Certified Nurse Midwife

## 2019-04-17 ENCOUNTER — Other Ambulatory Visit: Payer: Self-pay

## 2019-04-17 VITALS — BP 101/65 | HR 84 | Wt 257.5 lb

## 2019-04-17 DIAGNOSIS — Z3480 Encounter for supervision of other normal pregnancy, unspecified trimester: Secondary | ICD-10-CM | POA: Diagnosis not present

## 2019-04-17 DIAGNOSIS — Z363 Encounter for antenatal screening for malformations: Secondary | ICD-10-CM | POA: Diagnosis not present

## 2019-04-17 DIAGNOSIS — Z3481 Encounter for supervision of other normal pregnancy, first trimester: Secondary | ICD-10-CM

## 2019-04-17 LAB — POCT URINALYSIS DIPSTICK OB
Bilirubin, UA: NEGATIVE
Blood, UA: NEGATIVE
Glucose, UA: NEGATIVE
Ketones, UA: NEGATIVE
Leukocytes, UA: NEGATIVE
Nitrite, UA: NEGATIVE
POC,PROTEIN,UA: NEGATIVE
Spec Grav, UA: 1.01 (ref 1.010–1.025)
Urobilinogen, UA: 0.2 E.U./dL
pH, UA: 5 (ref 5.0–8.0)

## 2019-04-17 NOTE — Progress Notes (Signed)
[name] doing well. Anatomy can today. Results reviewed (see below). Discussed normal musculoskeletal discomforts of pregnancy. Pt verbalizes understanding. TSH today- hx hypothyroidism. Will follow up with results. Pt to follow up 4 wks.   Philip Aspen, CNM   Patient Name: Kristi [name] DOB: 1986/01/25 MRN: [id number] ULTRASOUND REPORT  Location: [facility] Date of Service: 04/17/2019   Indications:Anatomy Ultrasound Findings:  Nelda Marseille intrauterine pregnancy is visualized with FHR at 142 BPM. Biometrics give an (U/S) Gestational age of [redacted]w[redacted]d and an (U/S) EDD of 08/28/2019; this correlates with the clinically established Estimated Date of Delivery: 08/31/19  Fetal presentation is Variable.  EFW: 388 g ( 14 oz). Fetal Percentile 60%  Placenta: posterior. Grade: 1 AFI: subjectively normal.  Anatomic survey is complete and normal; Gender - female.    Right Ovary is normal in appearance. Left Ovary is normal appearance. Survey of the adnexa demonstrates no adnexal masses. There is no free peritoneal fluid in the cul de sac.  Impression: 1. [redacted]w[redacted]d Viable Singleton Intrauterine pregnancy by U/S. 2. (U/S) EDD is consistent with Clinically established Estimated Date of Delivery: 08/31/19 . 3. Normal Anatomy Scan

## 2019-04-17 NOTE — Patient Instructions (Signed)

## 2019-04-18 LAB — TSH: TSH: 2.76 u[IU]/mL (ref 0.450–4.500)

## 2019-05-06 DIAGNOSIS — B9689 Other specified bacterial agents as the cause of diseases classified elsewhere: Secondary | ICD-10-CM | POA: Diagnosis not present

## 2019-05-06 DIAGNOSIS — J019 Acute sinusitis, unspecified: Secondary | ICD-10-CM | POA: Diagnosis not present

## 2019-05-06 DIAGNOSIS — J029 Acute pharyngitis, unspecified: Secondary | ICD-10-CM | POA: Diagnosis not present

## 2019-05-07 ENCOUNTER — Telehealth: Payer: Self-pay

## 2019-05-07 NOTE — Telephone Encounter (Signed)
Mychart message sent to patient- what are the symptoms?

## 2019-05-07 NOTE — Telephone Encounter (Signed)
Patient is 23 weeks and tested positive for Covid on 05/06/19. Patient would like to know which medications are safe, to take for covid symptoms

## 2019-05-07 NOTE — Telephone Encounter (Signed)
Pt called and informed that I would have to ask Dr. Marcelline Mates when she arrive to the office tomorrow. Pt stated that she was okay with waiting.

## 2019-05-07 NOTE — Telephone Encounter (Signed)
The same medications listed on her safe medication sheet given during her NOB intake.  These are still ok to take for mild symptoms. If her symptoms progress such as she becomes short of breath, chest pain, severe dizziness or intense heart palpitations, she will need to f/u in the Emergency Room. Also, where did she have her testing done. If it wasn't within the [facility] system, please be sure to document her COVID status.   Dr. Marcelline Mates

## 2019-05-07 NOTE — Telephone Encounter (Signed)
Spoke with pt and related the information given by [facility]. Pt stated that she went to [facility] walk-in clinic and was tested positive. Pt stated that a coworker's daughters were positive but coworker's test was negative. Pt stated that she was having sinus like symptoms and headache. Pt stated that she thought she had a sinus infection, but she had COVID.

## 2019-05-08 DIAGNOSIS — J329 Chronic sinusitis, unspecified: Secondary | ICD-10-CM | POA: Diagnosis not present

## 2019-05-08 DIAGNOSIS — U071 COVID-19: Secondary | ICD-10-CM | POA: Diagnosis not present

## 2019-05-08 NOTE — Telephone Encounter (Signed)
Yes. She can take Robitussin or Tylenol cold for cough. Sudafed, Alka-Seltzer sinus, or Benadryl for sinus symptoms. These should be listed on her safe med list for pregnancy.

## 2019-05-11 NOTE — L&D Delivery Note (Signed)
       Delivery Note   Kristi [name] is a 34 y.o. [id number] at [redacted]w[redacted]d Estimated Date of Delivery: 08/31/19  PRE-OPERATIVE DIAGNOSIS:  1) [redacted]w[redacted]d pregnancy.   POST-OPERATIVE DIAGNOSIS:  1) [redacted]w[redacted]d pregnancy s/p Vaginal, Spontaneous   Delivery Type: Vaginal, Spontaneous    Delivery Anesthesia: Epidural   Labor Complications:  none    ESTIMATED BLOOD LOSS:    FINDINGS:   1) female infant, Apgar scores of 8   at 1 minute and 9   at 5 minutes and a birthweight is pending, infant remains skin to skin.    2) Nuchal cord: No  SPECIMENS:   PLACENTA:   Appearance: Intact , 3 vessel cord, cord blood collected   Removal: Spontaneous      Disposition:  per protocol   DISPOSITION:  Infant to left in stable condition in the delivery room, with L&D personnel and mother,  NARRATIVE SUMMARY: Labor course:  Ms. Kristi [name] is a [id number] at [redacted]w[redacted]d who presented for induction of labor for elevated BMI in pregnancy.  She progressed well in labor with pitocin.  She received the appropriate epidural anesthesia and proceeded to complete dilation. She evidenced good maternal expulsive effort during the second stage. She went on to deliver a viable female infant "Kristi [name]". The placenta delivered without problems and was noted to be complete. A perineal and vaginal examination was performed. Episiotomy/Lacerations: 1st degree . Tje laceration was repaired with 3-0 Vicryl Rapide  Suture. The patient tolerated this well.  Doreene Burke, CNM  08/25/2019 4:27 PM

## 2019-05-15 ENCOUNTER — Encounter: Payer: BC Managed Care – PPO | Admitting: Obstetrics and Gynecology

## 2019-05-17 ENCOUNTER — Encounter: Payer: Self-pay | Admitting: Obstetrics and Gynecology

## 2019-05-17 ENCOUNTER — Telehealth: Payer: Self-pay

## 2019-05-17 NOTE — Telephone Encounter (Signed)
Pt called no answer LM via VM concerning her calling the office on 05/07/19 with positive COVID testing. Pt needs to reschedule appointment. Only been 10 days since positive COVID test.

## 2019-05-18 ENCOUNTER — Other Ambulatory Visit: Payer: Self-pay

## 2019-05-18 MED ORDER — FLUCONAZOLE 150 MG PO TABS: 150.0000 mg | ORAL_TABLET | Freq: Once | ORAL | 0 refills | Status: AC

## 2019-05-23 ENCOUNTER — Ambulatory Visit (INDEPENDENT_AMBULATORY_CARE_PROVIDER_SITE_OTHER): Payer: BC Managed Care – PPO | Admitting: Obstetrics and Gynecology

## 2019-05-23 ENCOUNTER — Other Ambulatory Visit: Payer: Self-pay

## 2019-05-23 ENCOUNTER — Encounter: Payer: Self-pay | Admitting: Obstetrics and Gynecology

## 2019-05-23 VITALS — BP 113/72 | HR 91 | Wt 268.0 lb

## 2019-05-23 DIAGNOSIS — Z8616 Personal history of COVID-19: Secondary | ICD-10-CM

## 2019-05-23 DIAGNOSIS — Z130 Encounter for screening for diseases of the blood and blood-forming organs and certain disorders involving the immune mechanism: Secondary | ICD-10-CM

## 2019-05-23 DIAGNOSIS — Z131 Encounter for screening for diabetes mellitus: Secondary | ICD-10-CM

## 2019-05-23 DIAGNOSIS — Z3A26 26 weeks gestation of pregnancy: Secondary | ICD-10-CM

## 2019-05-23 DIAGNOSIS — E039 Hypothyroidism, unspecified: Secondary | ICD-10-CM

## 2019-05-23 DIAGNOSIS — Z3482 Encounter for supervision of other normal pregnancy, second trimester: Secondary | ICD-10-CM

## 2019-05-23 LAB — POCT URINALYSIS DIPSTICK OB
Bilirubin, UA: NEGATIVE
Blood, UA: NEGATIVE
Glucose, UA: NEGATIVE
Ketones, UA: NEGATIVE
Leukocytes, UA: NEGATIVE
Nitrite, UA: NEGATIVE
POC,PROTEIN,UA: NEGATIVE
Spec Grav, UA: 1.015 (ref 1.010–1.025)
Urobilinogen, UA: 0.2 E.U./dL
pH, UA: 6.5 (ref 5.0–8.0)

## 2019-05-23 NOTE — Progress Notes (Signed)
[name]-Pt present for routine prenatal care. Pt stated having lower abd and back pain.

## 2019-05-23 NOTE — Progress Notes (Signed)
Consult [name]: Presents from midwifery service. Reports low back pain, normal for her during pregnancy around this time.  Recently had bout of COVID, diagnosed 12/27. Overall notes feeling ok. Discussion had with patient regarding desire for repeat vaginal delivery vs primary C-section secondary to 4th degree tear after VAVD last pregnancy of 9 lb infant. Patient thinks she will wait for ultrasound prior to making decision, but advised that if she changes her mind and opts for C-section sooner, she can return to MD side for pre-operative visit. RTC in 4 weeks, for 28 week labs and repeat thyroid labs at that time.

## 2019-05-24 DIAGNOSIS — Z8616 Personal history of COVID-19: Secondary | ICD-10-CM | POA: Insufficient documentation

## 2019-06-18 ENCOUNTER — Other Ambulatory Visit: Payer: Self-pay

## 2019-06-18 ENCOUNTER — Other Ambulatory Visit: Payer: BC Managed Care – PPO

## 2019-06-18 ENCOUNTER — Encounter: Payer: Self-pay | Admitting: Certified Nurse Midwife

## 2019-06-18 ENCOUNTER — Ambulatory Visit (INDEPENDENT_AMBULATORY_CARE_PROVIDER_SITE_OTHER): Payer: BC Managed Care – PPO | Admitting: Certified Nurse Midwife

## 2019-06-18 VITALS — BP 104/67 | HR 90 | Wt 272.2 lb

## 2019-06-18 DIAGNOSIS — Z23 Encounter for immunization: Secondary | ICD-10-CM | POA: Diagnosis not present

## 2019-06-18 DIAGNOSIS — Z3482 Encounter for supervision of other normal pregnancy, second trimester: Secondary | ICD-10-CM

## 2019-06-18 DIAGNOSIS — Z3481 Encounter for supervision of other normal pregnancy, first trimester: Secondary | ICD-10-CM

## 2019-06-18 DIAGNOSIS — Z130 Encounter for screening for diseases of the blood and blood-forming organs and certain disorders involving the immune mechanism: Secondary | ICD-10-CM

## 2019-06-18 DIAGNOSIS — E039 Hypothyroidism, unspecified: Secondary | ICD-10-CM

## 2019-06-18 DIAGNOSIS — Z131 Encounter for screening for diabetes mellitus: Secondary | ICD-10-CM

## 2019-06-18 LAB — POCT URINALYSIS DIPSTICK OB
Bilirubin, UA: NEGATIVE
Blood, UA: NEGATIVE
Glucose, UA: NEGATIVE
Ketones, UA: NEGATIVE
Leukocytes, UA: NEGATIVE
Nitrite, UA: NEGATIVE
POC,PROTEIN,UA: NEGATIVE
Spec Grav, UA: 1.015 (ref 1.010–1.025)
Urobilinogen, UA: 0.2 E.U./dL
pH, UA: 5 (ref 5.0–8.0)

## 2019-06-18 MED ORDER — TETANUS-DIPHTH-ACELL PERTUSSIS 5-2.5-18.5 LF-MCG/0.5 IM SUSP
0.5000 mL | Freq: Once | INTRAMUSCULAR | Status: AC
  Administered 2019-06-18: 09:00:00 0.5 mL via INTRAMUSCULAR

## 2019-06-18 NOTE — Lactation Note (Signed)
Lactation Consultation Note  Patient Name: BENNIE SCAFF EHUDJ'S Date: 06/18/2019  Lactation student discussed benefits of breastfeeding per the Ready, Set, Baby curriculum. [name] encouraged to review breastfeeding information on Ready, set, [employer] site and given information for virtual breastfeeding classes.      Maternal Data    Feeding    LATCH Score                   Interventions    Lactation Tools Discussed/Used     Consult Status      Cornelious Bryant 06/18/2019, 8:49 AM

## 2019-06-18 NOTE — Patient Instructions (Signed)
Glucose Tolerance Test During Pregnancy Why am I having this test? The glucose tolerance test (GTT) is done to check how your body processes sugar (glucose). This is one of several tests used to diagnose diabetes that develops during pregnancy (gestational diabetes mellitus). Gestational diabetes is a temporary form of diabetes that some women develop during pregnancy. It usually occurs during the second trimester of pregnancy and goes away after delivery. Testing (screening) for gestational diabetes usually occurs between 24 and 28 weeks of pregnancy. You may have the GTT test after having a 1-hour glucose screening test if the results from that test indicate that you may have gestational diabetes. You may also have this test if:  You have a history of gestational diabetes.  You have a history of giving birth to very large babies or have experienced repeated fetal loss (stillbirth).  You have signs and symptoms of diabetes, such as: ? Changes in your vision. ? Tingling or numbness in your hands or feet. ? Changes in hunger, thirst, and urination that are not otherwise explained by your pregnancy. What is being tested? This test measures the amount of glucose in your blood at different times during a period of 3 hours. This indicates how well your body is able to process glucose. What kind of sample is taken?  Blood samples are required for this test. They are usually collected by inserting a needle into a blood vessel. How do I prepare for this test?  For 3 days before your test, eat normally. Have plenty of carbohydrate-rich foods.  Follow instructions from your health care provider about: ? Eating or drinking restrictions on the day of the test. You may be asked to not eat or drink anything other than water (fast) starting 8-10 hours before the test. ? Changing or stopping your regular medicines. Some medicines may interfere with this test. Tell a health care provider about:  All  medicines you are taking, including vitamins, herbs, eye drops, creams, and over-the-counter medicines.  Any blood disorders you have.  Any surgeries you have had.  Any medical conditions you have. What happens during the test? First, your blood glucose will be measured. This is referred to as your fasting blood glucose, since you fasted before the test. Then, you will drink a glucose solution that contains a certain amount of glucose. Your blood glucose will be measured again 1, 2, and 3 hours after drinking the solution. This test takes about 3 hours to complete. You will need to stay at the testing location during this time. During the testing period:  Do not eat or drink anything other than the glucose solution.  Do not exercise.  Do not use any products that contain nicotine or tobacco, such as cigarettes and e-cigarettes. If you need help stopping, ask your health care provider. The testing procedure may vary among health care providers and hospitals. How are the results reported? Your results will be reported as milligrams of glucose per deciliter of blood (mg/dL) or millimoles per liter (mmol/L). Your health care provider will compare your results to normal ranges that were established after testing a large group of people (reference ranges). Reference ranges may vary among labs and hospitals. For this test, common reference ranges are:  Fasting: less than 95-105 mg/dL (5.3-5.8 mmol/L).  1 hour after drinking glucose: less than 180-190 mg/dL (10.0-10.5 mmol/L).  2 hours after drinking glucose: less than 155-165 mg/dL (8.6-9.2 mmol/L).  3 hours after drinking glucose: 140-145 mg/dL (7.8-8.1 mmol/L). What do the   results mean? Results within reference ranges are considered normal, meaning that your glucose levels are well-controlled. If two or more of your blood glucose levels are high, you may be diagnosed with gestational diabetes. If only one level is high, your health care  provider may suggest repeat testing or other tests to confirm a diagnosis. Talk with your health care provider about what your results mean. Questions to ask your health care provider Ask your health care provider, or the department that is doing the test:  When will my results be ready?  How will I get my results?  What are my treatment options?  What other tests do I need?  What are my next steps? Summary  The glucose tolerance test (GTT) is one of several tests used to diagnose diabetes that develops during pregnancy (gestational diabetes mellitus). Gestational diabetes is a temporary form of diabetes that some women develop during pregnancy.  You may have the GTT test after having a 1-hour glucose screening test if the results from that test indicate that you may have gestational diabetes. You may also have this test if you have any symptoms or risk factors for gestational diabetes.  Talk with your health care provider about what your results mean. This information is not intended to replace advice given to you by your health care provider. Make sure you discuss any questions you have with your health care provider. Document Revised: 08/17/2018 Document Reviewed: 12/06/2016 Elsevier Patient Education  2020 Elsevier Inc.  

## 2019-06-18 NOTE — Progress Notes (Signed)
[name] doing well. Feels good movement. Discussed birth control , information given. Sample birth plan given. Will follow up at next appointment. 28 wk labs today. Will follow up with results. Tdap/BTC/CBC/RPR/Tsh/gglucose screen today. Pt nervous about route of delivery. Reassurance given. Discussed u/s 36 wks for growth. Follow up 2 wks with Marcelino Duster for [name].   Doreene Burke, CNM

## 2019-06-19 LAB — CBC
Hematocrit: 33 % — ABNORMAL LOW (ref 34.0–46.6)
Hemoglobin: 11.1 g/dL (ref 11.1–15.9)
MCH: 29.1 pg (ref 26.6–33.0)
MCHC: 33.6 g/dL (ref 31.5–35.7)
MCV: 87 fL (ref 79–97)
Platelets: 236 10*3/uL (ref 150–450)
RBC: 3.81 x10E6/uL (ref 3.77–5.28)
RDW: 13.3 % (ref 11.7–15.4)
WBC: 10.4 10*3/uL (ref 3.4–10.8)

## 2019-06-19 LAB — THYROID PANEL WITH TSH
Free Thyroxine Index: 1.3 (ref 1.2–4.9)
T3 Uptake Ratio: 12 % — ABNORMAL LOW (ref 24–39)
T4, Total: 11 ug/dL (ref 4.5–12.0)
TSH: 3.06 u[IU]/mL (ref 0.450–4.500)

## 2019-06-19 LAB — ANTIBODY SCREEN: Antibody Screen: NEGATIVE

## 2019-06-19 LAB — GLUCOSE, 1 HOUR GESTATIONAL: Gestational Diabetes Screen: 106 mg/dL (ref 65–139)

## 2019-06-19 LAB — RPR: RPR Ser Ql: NONREACTIVE

## 2019-06-25 ENCOUNTER — Telehealth: Payer: Self-pay

## 2019-06-25 ENCOUNTER — Telehealth: Payer: Self-pay | Admitting: Obstetrics and Gynecology

## 2019-06-25 NOTE — Telephone Encounter (Signed)
Patient called the office to report she thinks she lost her mucous plug. She also states she had some spotting and slight cramping. Patient was asked if she had had recent intercourse. She did and she was reassured the spotting was normal and will subside. Also the cramping most likely is [employer]. Advised to reach out if the cramping becomes more severe and the spotting heavier. Patient was agreeable.

## 2019-06-25 NOTE — Telephone Encounter (Signed)
Patient called saying she thinks she may have lost her mucus plug 2/14, shes been bleeding accompanied by pain in the vaginal area. She thinks she may be having braxton hicks. Could you please advise patient.   -TC

## 2019-07-02 ENCOUNTER — Other Ambulatory Visit: Payer: Self-pay

## 2019-07-02 ENCOUNTER — Ambulatory Visit (INDEPENDENT_AMBULATORY_CARE_PROVIDER_SITE_OTHER): Payer: BC Managed Care – PPO | Admitting: Certified Nurse Midwife

## 2019-07-02 VITALS — BP 103/66 | HR 81 | Wt 276.4 lb

## 2019-07-02 DIAGNOSIS — Z3493 Encounter for supervision of normal pregnancy, unspecified, third trimester: Secondary | ICD-10-CM

## 2019-07-02 DIAGNOSIS — Z3A31 31 weeks gestation of pregnancy: Secondary | ICD-10-CM

## 2019-07-02 LAB — POCT URINALYSIS DIPSTICK OB
Bilirubin, UA: NEGATIVE
Blood, UA: NEGATIVE
Glucose, UA: NEGATIVE
Ketones, UA: NEGATIVE
Nitrite, UA: NEGATIVE
POC,PROTEIN,UA: NEGATIVE
Spec Grav, UA: 1.01 (ref 1.010–1.025)
Urobilinogen, UA: 0.2 E.U./dL
pH, UA: 6.5 (ref 5.0–8.0)

## 2019-07-02 NOTE — Progress Notes (Signed)
[facility] student answered some questions in relation to breastpumps for patient.  Patient plan on BF.  She BF her first baby.

## 2019-07-02 NOTE — Progress Notes (Signed)
[name]-Reports consistent pelvic pain, no relief with Tylenol. Discussed home treatment measures including use of abdominal support and birthing ball. Education regarding pelvic floor physical therapy and perineal stretch/massage; patient to contact for additional information. Anticipatory guidance regarding course of prenatal care. Reviewed red flag symptoms and when to call. RTC x 2 weeks for [name] or sooner if needed.

## 2019-07-02 NOTE — Patient Instructions (Signed)
Fetal Movement Counts Patient Name: ________________________________________________ Patient Due Date: ____________________ What is a fetal movement count?  A fetal movement count is the number of times that you feel your baby move during a certain amount of time. This may also be called a fetal kick count. A fetal movement count is recommended for every pregnant woman. You may be asked to start counting fetal movements as early as week 28 of your pregnancy. Pay attention to when your baby is most active. You may notice your baby's sleep and wake cycles. You may also notice things that make your baby move more. You should do a fetal movement count:  When your baby is normally most active.  At the same time each day. A good time to count movements is while you are resting, after having something to eat and drink. How do I count fetal movements? 1. Find a quiet, comfortable area. Sit, or lie down on your side. 2. Write down the date, the start time and stop time, and the number of movements that you felt between those two times. Take this information with you to your health care visits. 3. Write down your start time when you feel the first movement. 4. Count kicks, flutters, swishes, rolls, and jabs. You should feel at least 10 movements. 5. You may stop counting after you have felt 10 movements, or if you have been counting for 2 hours. Write down the stop time. 6. If you do not feel 10 movements in 2 hours, contact your health care provider for further instructions. Your health care provider may want to do additional tests to assess your baby's well-being. Contact a health care provider if:  You feel fewer than 10 movements in 2 hours.  Your baby is not moving like he or she usually does. Date: ____________ Start time: ____________ Stop time: ____________ Movements: ____________ Date: ____________ Start time: ____________ Stop time: ____________ Movements: ____________ Date: ____________  Start time: ____________ Stop time: ____________ Movements: ____________ Date: ____________ Start time: ____________ Stop time: ____________ Movements: ____________ Date: ____________ Start time: ____________ Stop time: ____________ Movements: ____________ Date: ____________ Start time: ____________ Stop time: ____________ Movements: ____________ Date: ____________ Start time: ____________ Stop time: ____________ Movements: ____________ Date: ____________ Start time: ____________ Stop time: ____________ Movements: ____________ Date: ____________ Start time: ____________ Stop time: ____________ Movements: ____________ This information is not intended to replace advice given to you by your health care provider. Make sure you discuss any questions you have with your health care provider. Document Revised: 12/14/2018 Document Reviewed: 12/14/2018 Elsevier Patient Education  2020 [employer] Inc[employer]  

## 2019-07-02 NOTE — Progress Notes (Signed)
[name]-Patient c/o vaginal spotting and mucous vaginal discharge that lasted about a day 1 week ago, none since then.  Also c/o constant pelvic pain, takes Tylenol with no relief.

## 2019-07-17 ENCOUNTER — Ambulatory Visit (INDEPENDENT_AMBULATORY_CARE_PROVIDER_SITE_OTHER): Payer: BC Managed Care – PPO | Admitting: Certified Nurse Midwife

## 2019-07-17 ENCOUNTER — Encounter: Payer: Self-pay | Admitting: Certified Nurse Midwife

## 2019-07-17 ENCOUNTER — Other Ambulatory Visit: Payer: Self-pay

## 2019-07-17 VITALS — BP 105/64 | HR 100 | Wt 278.1 lb

## 2019-07-17 DIAGNOSIS — Z3483 Encounter for supervision of other normal pregnancy, third trimester: Secondary | ICD-10-CM

## 2019-07-17 LAB — POCT URINALYSIS DIPSTICK OB
Bilirubin, UA: NEGATIVE
Blood, UA: NEGATIVE
Glucose, UA: NEGATIVE
Ketones, UA: NEGATIVE
Leukocytes, UA: NEGATIVE
Nitrite, UA: NEGATIVE
POC,PROTEIN,UA: NEGATIVE
Spec Grav, UA: 1.01 (ref 1.010–1.025)
Urobilinogen, UA: 0.2 E.U./dL
pH, UA: 5 (ref 5.0–8.0)

## 2019-07-17 NOTE — Addendum Note (Signed)
Addended by: Brooke Dare on: 07/17/2019 09:54 AM   Modules accepted: Orders

## 2019-07-17 NOTE — Patient Instructions (Signed)
Braxton Hicks Contractions °Contractions of the uterus can occur throughout pregnancy, but they are not always a sign that you are in labor. You may have practice contractions called Braxton Hicks contractions. These false labor contractions are sometimes confused with true labor. °What are Braxton Hicks contractions? °Braxton Hicks contractions are tightening movements that occur in the muscles of the uterus before labor. Unlike true labor contractions, these contractions do not result in opening (dilation) and thinning of the cervix. Toward the end of pregnancy (32-34 weeks), Braxton Hicks contractions can happen more often and may become stronger. These contractions are sometimes difficult to tell apart from true labor because they can be very uncomfortable. You should not feel embarrassed if you go to the hospital with false labor. °Sometimes, the only way to tell if you are in true labor is for your health care provider to look for changes in the cervix. The health care provider will do a physical exam and may monitor your contractions. If you are not in true labor, the exam should show that your cervix is not dilating and your water has not broken. °If there are no other health problems associated with your pregnancy, it is completely safe for you to be sent home with false labor. You may continue to have Braxton Hicks contractions until you go into true labor. °How to tell the difference between true labor and false labor °True labor °· Contractions last 30-70 seconds. °· Contractions become very regular. °· Discomfort is usually felt in the top of the uterus, and it spreads to the lower abdomen and low back. °· Contractions do not go away with walking. °· Contractions usually become more intense and increase in frequency. °· The cervix dilates and gets thinner. °False labor °· Contractions are usually shorter and not as strong as true labor contractions. °· Contractions are usually irregular. °· Contractions  are often felt in the front of the lower abdomen and in the groin. °· Contractions may go away when you walk around or change positions while lying down. °· Contractions get weaker and are shorter-lasting as time goes on. °· The cervix usually does not dilate or become thin. °Follow these instructions at home: ° °· Take over-the-counter and prescription medicines only as told by your health care provider. °· Keep up with your usual exercises and follow other instructions from your health care provider. °· Eat and drink lightly if you think you are going into labor. °· If Braxton Hicks contractions are making you uncomfortable: °? Change your position from lying down or resting to walking, or change from walking to resting. °? Sit and rest in a tub of warm water. °? Drink enough fluid to keep your urine pale yellow. Dehydration may cause these contractions. °? Do slow and deep breathing several times an hour. °· Keep all follow-up prenatal visits as told by your health care provider. This is important. °Contact a health care provider if: °· You have a fever. °· You have continuous pain in your abdomen. °Get help right away if: °· Your contractions become stronger, more regular, and closer together. °· You have fluid leaking or gushing from your vagina. °· You pass blood-tinged mucus (bloody show). °· You have bleeding from your vagina. °· You have low back pain that you never had before. °· You feel your baby’s head pushing down and causing pelvic pressure. °· Your baby is not moving inside you as much as it used to. °Summary °· Contractions that occur before labor are   called Braxton Hicks contractions, false labor, or practice contractions. °· Braxton Hicks contractions are usually shorter, weaker, farther apart, and less regular than true labor contractions. True labor contractions usually become progressively stronger and regular, and they become more frequent. °· Manage discomfort from Braxton Hicks contractions  by changing position, resting in a warm bath, drinking plenty of water, or practicing deep breathing. °This information is not intended to replace advice given to you by your health care provider. Make sure you discuss any questions you have with your health care provider. °Document Revised: 04/08/2017 Document Reviewed: 09/09/2016 °Elsevier Patient Education © 2020 Elsevier Inc. ° °

## 2019-07-17 NOTE — Progress Notes (Signed)
[name] doing well. Feels good movement. Is very anxious about back pain, round ligament pain and vaginal pressure. Discussed signs and symptoms of PTL. Reassurance given. Encouraged use of belly band, chiropractor and offered referral for pelvic floor pt-she declines at this time. Discused plan to do growth u/s in 3 wks due to hx of macrosomia 4th degree tear. Discussed induction at 39 wks. Body mass index is 39.9 kg/m. Stated that if baby measuring big can repeat u/s for growth at 38 wks. She verbalizes and agrees to plan. Follow up 2 wks with Marcelino Duster.   Doreene Burke, CNM

## 2019-07-19 ENCOUNTER — Other Ambulatory Visit: Payer: Self-pay

## 2019-07-19 ENCOUNTER — Encounter: Payer: Self-pay | Admitting: Obstetrics and Gynecology

## 2019-07-19 ENCOUNTER — Observation Stay
Admission: EM | Admit: 2019-07-19 | Discharge: 2019-07-19 | Disposition: A | Discharge status: Home / Self Care | Payer: BC Managed Care – PPO | Attending: Certified Nurse Midwife | Admitting: Certified Nurse Midwife

## 2019-07-19 DIAGNOSIS — R609 Edema, unspecified: Secondary | ICD-10-CM | POA: Insufficient documentation

## 2019-07-19 DIAGNOSIS — H538 Other visual disturbances: Secondary | ICD-10-CM | POA: Insufficient documentation

## 2019-07-19 DIAGNOSIS — Z3A33 33 weeks gestation of pregnancy: Secondary | ICD-10-CM | POA: Diagnosis not present

## 2019-07-19 DIAGNOSIS — Z79890 Hormone replacement therapy (postmenopausal): Secondary | ICD-10-CM | POA: Diagnosis not present

## 2019-07-19 DIAGNOSIS — R519 Headache, unspecified: Secondary | ICD-10-CM | POA: Insufficient documentation

## 2019-07-19 DIAGNOSIS — O99891 Other specified diseases and conditions complicating pregnancy: Secondary | ICD-10-CM | POA: Diagnosis not present

## 2019-07-19 DIAGNOSIS — O26893 Other specified pregnancy related conditions, third trimester: Principal | ICD-10-CM | POA: Insufficient documentation

## 2019-07-19 DIAGNOSIS — Z331 Pregnant state, incidental: Secondary | ICD-10-CM | POA: Diagnosis not present

## 2019-07-19 DIAGNOSIS — R600 Localized edema: Secondary | ICD-10-CM | POA: Diagnosis not present

## 2019-07-19 LAB — PROTEIN / CREATININE RATIO, URINE
Creatinine, Urine: 157 mg/dL
Protein Creatinine Ratio: 0.13 mg/mg{Cre} (ref 0.00–0.15)
Total Protein, Urine: 21 mg/dL

## 2019-07-19 LAB — CBC
HCT: 31.3 % — ABNORMAL LOW (ref 36.0–46.0)
Hemoglobin: 10 g/dL — ABNORMAL LOW (ref 12.0–15.0)
MCH: 27.8 pg (ref 26.0–34.0)
MCHC: 31.9 g/dL (ref 30.0–36.0)
MCV: 86.9 fL (ref 80.0–100.0)
Platelets: 221 10*3/uL (ref 150–400)
RBC: 3.6 MIL/uL — ABNORMAL LOW (ref 3.87–5.11)
RDW: 14.4 % (ref 11.5–15.5)
WBC: 9.1 10*3/uL (ref 4.0–10.5)
nRBC: 0 % (ref 0.0–0.2)

## 2019-07-19 LAB — COMPREHENSIVE METABOLIC PANEL
ALT: 14 U/L (ref 0–44)
AST: 17 U/L (ref 15–41)
Albumin: 2.7 g/dL — ABNORMAL LOW (ref 3.5–5.0)
Alkaline Phosphatase: 63 U/L (ref 38–126)
Anion gap: 10 (ref 5–15)
BUN: 6 mg/dL (ref 6–20)
CO2: 21 mmol/L — ABNORMAL LOW (ref 22–32)
Calcium: 9 mg/dL (ref 8.9–10.3)
Chloride: 105 mmol/L (ref 98–111)
Creatinine, Ser: 0.45 mg/dL (ref 0.44–1.00)
GFR calc Af Amer: >60 mL/min (ref >60)
GFR calc non Af Amer: >60 mL/min (ref >60)
Glucose, Bld: 105 mg/dL — ABNORMAL HIGH (ref 70–99)
Potassium: 3.2 mmol/L — ABNORMAL LOW (ref 3.5–5.1)
Sodium: 136 mmol/L (ref 135–145)
Total Bilirubin: 0.3 mg/dL (ref 0.3–1.2)
Total Protein: 6.3 g/dL — ABNORMAL LOW (ref 6.5–8.1)

## 2019-07-19 LAB — TYPE AND SCREEN
ABO/RH(D): O POS
Antibody Screen: NEGATIVE

## 2019-07-19 MED ORDER — BUTALBITAL-APAP-CAFFEINE 50-325-40 MG PO TABS: 1.0000 | ORAL_TABLET | Freq: Once | ORAL | Status: DC

## 2019-07-19 MED ORDER — ZOLPIDEM TARTRATE 5 MG PO TABS
ORAL_TABLET | ORAL | Status: AC
  Filled 2019-07-19: qty 1

## 2019-07-19 MED ORDER — ZOLPIDEM TARTRATE 5 MG PO TABS
5.0000 mg | ORAL_TABLET | Freq: Every evening | ORAL | Status: DC | PRN
  Administered 2019-07-19: 5 mg via ORAL

## 2019-07-19 NOTE — OB Triage Note (Signed)
Patient came in for observation for Headache spots in her vision and lower extremity swelling since 1200. Patient rates headache 3/10. Patient denies uterine contractions. Patient reports + FM. Patient denies leaking of fluid and denies vaginal bleeding and spotting. Vital signs stable and patient afebrile. FHR baseline 145 with moderate variability with accelerations 15 x 15 and no decelerations. Family at bedside.

## 2019-07-19 NOTE — OB Triage Note (Signed)
L&D OB Triage Note  SUBJECTIVE Kristi [name] is a 34 y.o. G8P1011 female at [redacted]w[redacted]d, EDD Estimated Date of Delivery: 08/31/19 who presented to triage with complaints of headache, visual spots, and swelling .   OB History  Gravida Para Term Preterm AB Living  3 1 1  0 1 1  SAB TAB Ectopic Multiple Live Births  1 0 0 0 1    # Outcome Date GA Lbr Len/2nd Weight Sex Delivery Anes PTL Lv  3 Current           2 Term 05/15/14 [redacted]w[redacted]d / 04:30 4167 g M Vag-Vacuum EPI N LIV     Birth Comments: hematoma from vacuum, jaundice     Complications: Dysfunctional Labor  1 SAB 2015            Medications Prior to Admission  Medication Sig Dispense Refill Last Dose  . cholecalciferol (VITAMIN D3) 25 MCG (1000 UNIT) tablet Take 1,000 Units by mouth daily.   07/19/2019 at Unknown time  . levothyroxine (SYNTHROID) 137 MCG tablet Take 137 mcg by mouth daily before breakfast.   07/19/2019 at Unknown time  . Prenatal Vit-Fe Fumarate-FA (PRENATAL MULTIVITAMIN) TABS tablet Take 1 tablet by mouth daily at 12 noon.   07/19/2019 at Unknown time     OBJECTIVE  Nursing Evaluation:   BP 114/72   Pulse 89   Temp 98 F (36.7 C) (Oral)   Resp 18   Ht 5\' 10"  (1.778 m)   Wt 126.1 kg   LMP 11/24/2018   BMI 39.89 kg/m    Findings:   Negative pre E labs, normal BP      NST was performed and has been reviewed by me.  NST INTERPRETATION: Category I  Mode: External Baseline Rate (A): 145 bpm Variability: Moderate Accelerations: 15 x 15 Decelerations: None     Contraction Frequency (min): none  ASSESSMENT Impression:  1.  Pregnancy:  G3P1011 at [redacted]w[redacted]d , EDD Estimated Date of Delivery: 08/31/19 2.  Reassuring fetal and maternal status 3. Labs WNL ( see below)   CMP     Component Value Date/Time   NA 136 07/19/2019 2038   NA 141 02/19/2016 0950   NA 140 05/13/2014 1730   K 3.2 (L) 07/19/2019 2038   K 3.7 05/13/2014 1730   CL 105 07/19/2019 2038   CL 107 05/13/2014 1730   CO2 21 (L) 07/19/2019  2038   CO2 24 05/13/2014 1730   GLUCOSE 105 (H) 07/19/2019 2038   GLUCOSE 74 05/13/2014 1730   BUN 6 07/19/2019 2038   BUN 9 02/19/2016 0950   BUN 6 (L) 05/13/2014 1730   CREATININE 0.45 07/19/2019 2038   CREATININE 0.54 (L) 05/13/2014 1730   CALCIUM 9.0 07/19/2019 2038   CALCIUM 8.6 05/13/2014 1730   PROT 6.3 (L) 07/19/2019 2038   PROT 7.4 02/19/2016 0950   ALBUMIN 2.7 (L) 07/19/2019 2038   ALBUMIN 4.4 02/19/2016 0950   AST 17 07/19/2019 2038   AST 21 05/13/2014 1730   ALT 14 07/19/2019 2038   ALKPHOS 63 07/19/2019 2038   BILITOT 0.3 07/19/2019 2038   BILITOT 0.3 02/19/2016 0950   GFRNONAA >60 07/19/2019 2038   GFRNONAA >60 05/13/2014 1730   GFRAA >60 07/19/2019 2038   GFRAA >60 05/13/2014 1730   CBC    Component Value Date/Time   WBC 9.1 07/19/2019 2038   RBC 3.60 (L) 07/19/2019 2038   HGB 10.0 (L) 07/19/2019 2038   HGB 11.1 06/18/2019 0820  HCT 31.3 (L) 07/19/2019 2038   HCT 33.0 (L) 06/18/2019 0820   PLT 221 07/19/2019 2038   PLT 236 06/18/2019 0820   MCV 86.9 07/19/2019 2038   MCV 87 06/18/2019 0820   MCV 89 05/13/2014 1730   MCH 27.8 07/19/2019 2038   MCHC 31.9 07/19/2019 2038   RDW 14.4 07/19/2019 2038   RDW 13.3 06/18/2019 0820   RDW 15.0 (H) 05/13/2014 1730   LYMPHSABS 1.6 05/08/2013 1125   MONOABS 0.5 05/08/2013 1125   EOSABS 0.1 05/08/2013 1125   BASOSABS 0.1 05/08/2013 1125   Creatinine, Urine mg/dL 161    Total Protein, Urine mg/dL 21    Comment: NO NORMAL RANGE ESTABLISHED FOR THIS TEST  Protein Creatinine Ratio 0.00 - 0.15 mg/mg 0.13    Comment: Performed at [facility], [address], [place], [place] [address]     PLAN 1. Discussed current condition and above findings with patient and reassurance given.  All questions answered. 2. Discharge home with standard labor precautions given to return to L&D or call the office for problems. 3. Continue routine prenatal care.  Doreene Burke, CNM

## 2019-07-20 ENCOUNTER — Ambulatory Visit (INDEPENDENT_AMBULATORY_CARE_PROVIDER_SITE_OTHER): Payer: BC Managed Care – PPO | Admitting: Certified Nurse Midwife

## 2019-07-20 VITALS — BP 110/76 | HR 82 | Wt 280.4 lb

## 2019-07-20 DIAGNOSIS — Z3493 Encounter for supervision of normal pregnancy, unspecified, third trimester: Secondary | ICD-10-CM

## 2019-07-20 DIAGNOSIS — Z0130 Encounter for examination of blood pressure without abnormal findings: Secondary | ICD-10-CM | POA: Diagnosis not present

## 2019-07-20 DIAGNOSIS — Z3A34 34 weeks gestation of pregnancy: Secondary | ICD-10-CM | POA: Diagnosis not present

## 2019-07-20 NOTE — Progress Notes (Signed)
Patient presented for a BP check. Patient was seen in ED 07/19/19 for visual changes, headache and swelling in extremities. Pre eclampsia labs were all WNL. Her last prenatal visit was 07/17/19 with Doreene Burke CNM.Weight was 278lbs1oz. BP 105/64 with no c/o. No c/o at present. Advised if symptoms returned to go to the ED. Patient expressed understanding.

## 2019-07-20 NOTE — Patient Instructions (Signed)
Fetal Movement Counts Patient Name: ________________________________________________ Patient Due Date: ____________________ What is a fetal movement count?  A fetal movement count is the number of times that you feel your baby move during a certain amount of time. This may also be called a fetal kick count. A fetal movement count is recommended for every pregnant woman. You may be asked to start counting fetal movements as early as week 28 of your pregnancy. Pay attention to when your baby is most active. You may notice your baby's sleep and wake cycles. You may also notice things that make your baby move more. You should do a fetal movement count:  When your baby is normally most active.  At the same time each day. A good time to count movements is while you are resting, after having something to eat and drink. How do I count fetal movements? 1. Find a quiet, comfortable area. Sit, or lie down on your side. 2. Write down the date, the start time and stop time, and the number of movements that you felt between those two times. Take this information with you to your health care visits. 3. Write down your start time when you feel the first movement. 4. Count kicks, flutters, swishes, rolls, and jabs. You should feel at least 10 movements. 5. You may stop counting after you have felt 10 movements, or if you have been counting for 2 hours. Write down the stop time. 6. If you do not feel 10 movements in 2 hours, contact your health care provider for further instructions. Your health care provider may want to do additional tests to assess your baby's well-being. Contact a health care provider if:  You feel fewer than 10 movements in 2 hours.  Your baby is not moving like he or she usually does. Date: ____________ Start time: ____________ Stop time: ____________ Movements: ____________ Date: ____________ Start time: ____________ Stop time: ____________ Movements: ____________ Date: ____________  Start time: ____________ Stop time: ____________ Movements: ____________ Date: ____________ Start time: ____________ Stop time: ____________ Movements: ____________ Date: ____________ Start time: ____________ Stop time: ____________ Movements: ____________ Date: ____________ Start time: ____________ Stop time: ____________ Movements: ____________ Date: ____________ Start time: ____________ Stop time: ____________ Movements: ____________ Date: ____________ Start time: ____________ Stop time: ____________ Movements: ____________ Date: ____________ Start time: ____________ Stop time: ____________ Movements: ____________ This information is not intended to replace advice given to you by your health care provider. Make sure you discuss any questions you have with your health care provider. Document Revised: 12/14/2018 Document Reviewed: 12/14/2018 Elsevier Patient Education  2020 [employer] Inc[employer]  

## 2019-07-20 NOTE — Progress Notes (Signed)
Subjective:   Kristi [name] is a 34 y.o. G3P1011 [redacted]w[redacted]d here for blood pressure check.  Patient seen in [facility] triage yesterday with complaints of headache, visual changes, and swelling. Labs were normal.   The following portions of the patient's history were reviewed and updated as appropriate: allergies, current medications, past family history, past medical history, past social history, past surgical history and problem list.   Objective:   BP 110/76   Pulse 82   Wt 280 lb 7 oz (127.2 kg)   LMP 11/24/2018   BMI 40.24 kg/m   PHYSICAL EXAM: Not indicated.   Results for orders placed or performed during the hospital encounter of 07/19/19 (from the past 24 hour(s))  Type and screen [facility]     Status: None   Collection Time: 07/19/19  8:38 PM  Result Value Ref Range   ABO/RH(D) O POS    Antibody Screen NEG    Sample Expiration      07/22/2019,2359 Performed at [facility], [address] [place], [place] [address]   CBC     Status: Abnormal   Collection Time: 07/19/19  8:38 PM  Result Value Ref Range   WBC 9.1 4.0 - 10.5 K/uL   RBC 3.60 (L) 3.87 - 5.11 MIL/uL   Hemoglobin 10.0 (L) 12.0 - 15.0 g/dL   HCT 19.6 (L) 22.2 - 97.9 %   MCV 86.9 80.0 - 100.0 fL   MCH 27.8 26.0 - 34.0 pg   MCHC 31.9 30.0 - 36.0 g/dL   RDW 89.2 11.9 - 41.7 %   Platelets 221 150 - 400 K/uL   nRBC 0.0 0.0 - 0.2 %  Comprehensive metabolic panel     Status: Abnormal   Collection Time: 07/19/19  8:38 PM  Result Value Ref Range   Sodium 136 135 - 145 mmol/L   Potassium 3.2 (L) 3.5 - 5.1 mmol/L   Chloride 105 98 - 111 mmol/L   CO2 21 (L) 22 - 32 mmol/L   Glucose, Bld 105 (H) 70 - 99 mg/dL   BUN 6 6 - 20 mg/dL   Creatinine, Ser 4.08 0.44 - 1.00 mg/dL   Calcium 9.0 8.9 - 14.4 mg/dL   Total Protein 6.3 (L) 6.5 - 8.1 g/dL   Albumin 2.7 (L) 3.5 - 5.0 g/dL   AST 17 15 - 41 U/L   ALT 14 0 - 44 U/L   Alkaline Phosphatase 63 38 - 126 U/L   Total Bilirubin 0.3 0.3 - 1.2  mg/dL   GFR calc non Af Amer >60 >60 mL/min   GFR calc Af Amer >60 >60 mL/min   Anion gap 10 5 - 15  Protein / creatinine ratio, urine     Status: None   Collection Time: 07/19/19  8:38 PM  Result Value Ref Range   Creatinine, Urine 157 mg/dL   Total Protein, Urine 21 mg/dL   Protein Creatinine Ratio 0.13 0.00 - 0.15 mg/mg[Cre]    Assessment:   Pregnancy:  G3P1011 at [redacted]w[redacted]d  1. Third trimester pregnancy   2. Blood pressure check   3. [redacted] weeks gestation of pregnancy   Plan:    Preterm labor symptoms: vaginal bleeding, contractions and leaking of fluid reviewed in detail.  Fetal movement precautions reviewed.  Pre-eclampsia precautions discussed and patient verbalized understanding.   Follow up as previously scheduled or sooner if needed.   Gunnar Bulla, CNM [facility], [facility] 07/20/19 4:03 PM

## 2019-08-02 ENCOUNTER — Other Ambulatory Visit: Payer: Self-pay

## 2019-08-02 ENCOUNTER — Ambulatory Visit (INDEPENDENT_AMBULATORY_CARE_PROVIDER_SITE_OTHER): Payer: BC Managed Care – PPO | Admitting: Certified Nurse Midwife

## 2019-08-02 VITALS — BP 104/74 | HR 95 | Wt 280.0 lb

## 2019-08-02 DIAGNOSIS — Z3493 Encounter for supervision of normal pregnancy, unspecified, third trimester: Secondary | ICD-10-CM

## 2019-08-02 DIAGNOSIS — Z3A35 35 weeks gestation of pregnancy: Secondary | ICD-10-CM

## 2019-08-02 LAB — POCT URINALYSIS DIPSTICK OB
Bilirubin, UA: NEGATIVE
Blood, UA: NEGATIVE
Glucose, UA: NEGATIVE
Ketones, UA: NEGATIVE
Leukocytes, UA: NEGATIVE
Nitrite, UA: NEGATIVE
POC,PROTEIN,UA: NEGATIVE
Spec Grav, UA: 1.015 (ref 1.010–1.025)
Urobilinogen, UA: 0.2 E.U./dL
pH, UA: 5 (ref 5.0–8.0)

## 2019-08-02 NOTE — Patient Instructions (Addendum)
Fetal Movement Counts Patient Name: ________________________________________________ Patient Due Date: ____________________ What is a fetal movement count?  A fetal movement count is the number of times that you feel your baby move during a certain amount of time. This may also be called a fetal kick count. A fetal movement count is recommended for every pregnant woman. You may be asked to start counting fetal movements as early as week 28 of your pregnancy. Pay attention to when your baby is most active. You may notice your baby's sleep and wake cycles. You may also notice things that make your baby move more. You should do a fetal movement count:  When your baby is normally most active.  At the same time each day. A good time to count movements is while you are resting, after having something to eat and drink. How do I count fetal movements? 1. Find a quiet, comfortable area. Sit, or lie down on your side. 2. Write down the date, the start time and stop time, and the number of movements that you felt between those two times. Take this information with you to your health care visits. 3. Write down your start time when you feel the first movement. 4. Count kicks, flutters, swishes, rolls, and jabs. You should feel at least 10 movements. 5. You may stop counting after you have felt 10 movements, or if you have been counting for 2 hours. Write down the stop time. 6. If you do not feel 10 movements in 2 hours, contact your health care provider for further instructions. Your health care provider may want to do additional tests to assess your baby's well-being. Contact a health care provider if:  You feel fewer than 10 movements in 2 hours.  Your baby is not moving like he or she usually does. Date: ____________ Start time: ____________ Stop time: ____________ Movements: ____________ Date: ____________ Start time: ____________ Stop time: ____________ Movements: ____________ Date: ____________  Start time: ____________ Stop time: ____________ Movements: ____________ Date: ____________ Start time: ____________ Stop time: ____________ Movements: ____________ Date: ____________ Start time: ____________ Stop time: ____________ Movements: ____________ Date: ____________ Start time: ____________ Stop time: ____________ Movements: ____________ Date: ____________ Start time: ____________ Stop time: ____________ Movements: ____________ Date: ____________ Start time: ____________ Stop time: ____________ Movements: ____________ Date: ____________ Start time: ____________ Stop time: ____________ Movements: ____________ This information is not intended to replace advice given to you by your health care provider. Make sure you discuss any questions you have with your health care provider. Document Revised: 12/14/2018 Document Reviewed: 12/14/2018 Elsevier Patient Education  2020 [employer]  Group B Streptococcus Test During Pregnancy Why am I having this test? Routine testing, also called screening, for group B streptococcus (GBS) is recommended for all pregnant women between the 36th and 37th week of pregnancy. GBS is a type of bacteria that can be passed from mother to baby during childbirth. Screening will help guide whether or not you will need treatment during labor and delivery to prevent complications such as:  An infection in your uterus during labor.  An infection in your uterus after delivery.  A serious infection in your baby after delivery, such as pneumonia, meningitis, or sepsis. GBS screening is not often done before 36 weeks of pregnancy unless you go into labor prematurely. What happens if I have group B streptococcus? If testing shows that you have GBS, your health care provider will recommend treatment with IV antibiotics during labor and delivery. This treatment significantly decreases the risk of complications  for you and your baby. If you have a planned C-section and you  have GBS, you may not need to be treated with antibiotics because GBS is usually passed to babies after labor starts and your water breaks. If you are in labor or your water breaks before your C-section, it is possible for GBS to get into your uterus and be passed to your baby, so you might need treatment. Is there a chance I may not need to be tested? You may not need to be tested for GBS if:  You have a urine test that shows GBS before 36 to 37 weeks.  You had a baby with GBS infection after a previous delivery. In these cases, you will automatically be treated for GBS during labor and delivery. What is being tested? This test is done to check if you have group B streptococcus in your vagina or rectum. What kind of sample is taken? To collect samples for this test, your health care provider will swab your vagina and rectum with a cotton swab. The sample is then sent to the lab to see if GBS is present. What happens during the test?   You will remove your clothing from the waist down.  You will lie down on an exam table in the same position as you would for a pelvic exam.  Your health care provider will swab your vagina and rectum to collect samples for a culture test.  You will be able to go home after the test and do all your usual activities. How are the results reported? The test results are reported as positive or negative. What do the results mean?  A positive test means you are at risk for passing GBS to your baby during labor and delivery. Your health care provider will recommend that you are treated with an IV antibiotic during labor and delivery.  A negative test means you are at very low risk of passing GBS to your baby. There is still a low risk of passing GBS to your baby because sometimes test results may report that you do not have a condition when you do (false-negative result) or there is a chance that you may become infected with GBS after the test is done. You most  likely will not need to be treated with an antibiotic during labor and delivery. Talk with your health care provider about what your results mean. Questions to ask your health care provider Ask your health care provider, or the department that is doing the test:  When will my results be ready?  How will I get my results?  What are my treatment options? Summary  Routine testing (screening) for group B streptococcus (GBS) is recommended for all pregnant women between the 36th and 37th week of pregnancy.  GBS is a type of bacteria that can be passed from mother to baby during childbirth.  If testing shows that you have GBS, your health care provider will recommend that you are treated with IV antibiotics during labor and delivery. This treatment almost always prevents infection in newborns. This information is not intended to replace advice given to you by your health care provider. Make sure you discuss any questions you have with your health care provider. Document Revised: 08/17/2018 Document Reviewed: 05/24/2018 Elsevier Patient Education  2020 [employer].

## 2019-08-02 NOTE — Progress Notes (Signed)
[name]-Doing well, reports increased vaginal pressure. Discussed home treatment measures. Requests SVE. 36 week cultures collected, see orders. Herbal prep and pre-labor checklist provided. Anticipatory guidance regarding course of prenatal care. Reviewed red flag symptoms and when to call. RTC x 1 week for growth ultrasound and [name] or sooner if needed.

## 2019-08-04 LAB — STREP GP B NAA: Strep Gp B NAA: NEGATIVE

## 2019-08-05 LAB — GC/CHLAMYDIA PROBE AMP
Chlamydia trachomatis, NAA: NEGATIVE
Neisseria Gonorrhoeae by PCR: NEGATIVE

## 2019-08-08 ENCOUNTER — Ambulatory Visit (INDEPENDENT_AMBULATORY_CARE_PROVIDER_SITE_OTHER): Payer: BC Managed Care – PPO

## 2019-08-08 ENCOUNTER — Ambulatory Visit (INDEPENDENT_AMBULATORY_CARE_PROVIDER_SITE_OTHER): Payer: BC Managed Care – PPO | Admitting: Certified Nurse Midwife

## 2019-08-08 ENCOUNTER — Encounter: Payer: Self-pay | Admitting: Certified Nurse Midwife

## 2019-08-08 ENCOUNTER — Other Ambulatory Visit: Payer: Self-pay

## 2019-08-08 VITALS — BP 100/70 | HR 95 | Wt 282.1 lb

## 2019-08-08 DIAGNOSIS — O365930 Maternal care for other known or suspected poor fetal growth, third trimester, not applicable or unspecified: Secondary | ICD-10-CM

## 2019-08-08 DIAGNOSIS — Z3483 Encounter for supervision of other normal pregnancy, third trimester: Secondary | ICD-10-CM

## 2019-08-08 DIAGNOSIS — Z3A38 38 weeks gestation of pregnancy: Secondary | ICD-10-CM | POA: Diagnosis not present

## 2019-08-08 DIAGNOSIS — Z3A36 36 weeks gestation of pregnancy: Secondary | ICD-10-CM

## 2019-08-08 DIAGNOSIS — Z362 Encounter for other antenatal screening follow-up: Secondary | ICD-10-CM | POA: Diagnosis not present

## 2019-08-08 DIAGNOSIS — Z3493 Encounter for supervision of normal pregnancy, unspecified, third trimester: Secondary | ICD-10-CM

## 2019-08-08 LAB — POCT URINALYSIS DIPSTICK OB
Bilirubin, UA: NEGATIVE
Blood, UA: NEGATIVE
Glucose, UA: NEGATIVE
Ketones, UA: NEGATIVE
Leukocytes, UA: NEGATIVE
Nitrite, UA: NEGATIVE
POC,PROTEIN,UA: NEGATIVE
Spec Grav, UA: >=1.03 — AB (ref 1.010–1.025)
Urobilinogen, UA: 0.2 E.U./dL
pH, UA: 5 (ref 5.0–8.0)

## 2019-08-08 NOTE — Progress Notes (Signed)
[name] doing well. Feels good movement. U/s today for growth/AFI due to hx of big baby and 4th degree lac. Results reviewed with pt. (see below). SVE per pt request. 3/60/-s. Pt at this point plans to try for vaginal birth. Discussed induction at 40 wks if labor has not occurred. Discussed repeat u/s for growth @ 39 wks if pt has not delivered. She verbalizes and agrees to plan . Follow up 1 wk with Marcelino Duster.  Doreene Burke, CNM   Patient Name: Kristi [name] DOB: 08/28/1985 MRN: [id number] ULTRASOUND REPORT  Location: [facility] Date of Service: 08/08/2019   Indications:growth/afi Findings:  Mason Jim intrauterine pregnancy is visualized with FHR at 133 BPM. Biometrics give an (U/S) Gestational age of [redacted]w[redacted]d and an (U/S) EDD of 08/17/2019; this correlates with the clinically established Estimated Date of Delivery: 08/31/19.  Fetal presentation is Cephalic.  Placenta: posterior. Grade: 1 AFI: 17.3 cm  Growth percentile is 65. EFW: 3260 g ( 7 lbs 3 oz)   Impression: 1. [redacted]w[redacted]d Viable Singleton Intrauterine pregnancy previously established criteria. 2. Growth is 65 %ile.  AFI is 17.9 cm.   Recommendations: 1.Clinical correlation with the patient's History and Physical Exam.   Jenine  M. Marciano Sequin     RDMS

## 2019-08-08 NOTE — Patient Instructions (Signed)
Group B Streptococcus Infection During Pregnancy °Group B Streptococcus (GBS) is a type of bacteria that is often found in healthy people. It is commonly found in the rectum, vagina, and intestines. In people who are healthy and not pregnant, the bacteria rarely cause serious illness or complications. However, women who test positive for GBS during pregnancy can pass the bacteria to the baby during childbirth. This can cause serious infection in the baby after birth. °Women with GBS may also have infections during their pregnancy or soon after childbirth. The infections include urinary tract infections (UTIs) or infections of the uterus. GBS also increases a woman's risk of complications during pregnancy, such as early labor or delivery, miscarriage, or stillbirth. Routine testing for GBS is recommended for all pregnant women. °What are the causes? °This condition is caused by bacteria called Streptococcus agalactiae. °What increases the risk? °You may have a higher risk for GBS infection during pregnancy if you had one during a past pregnancy. °What are the signs or symptoms? °In most cases, GBS infection does not cause symptoms in pregnant women. If symptoms exist, they may include: °· Labor that starts before the 37th week of pregnancy. °· A UTI or bladder infection. This may cause a fever, frequent urination, or pain and burning during urination. °· Fever during labor. There can also be a rapid heartbeat in the mother or baby. °Rare but serious symptoms of a GBS infection in women include: °· Blood infection (septicemia). This may cause fever, chills, or confusion. °· Lung infection (pneumonia). This may cause fever, chills, cough, rapid breathing, chest pain, or difficulty breathing. °· Bone, joint, skin, or soft tissue infection. °How is this diagnosed? °You may be screened for GBS between week 35 and week 37 of pregnancy. If you have symptoms of preterm labor, you may be screened earlier. This condition is  diagnosed based on lab test results from: °· A swab of fluid from the vagina and rectum. °· A urine sample. °How is this treated? °This condition is treated with antibiotic medicine. Antibiotic medicine may be given: °· To you when you go into labor, or as soon as your water breaks. The medicines will continue until after you give birth. If you are having a cesarean delivery, you do not need antibiotics unless your water has broken. °· To your baby, if he or she requires treatment. Your health care provider will check your baby to decide if he or she needs antibiotics to prevent a serious infection. °Follow these instructions at home: °· Take over-the-counter and prescription medicines only as told by your health care provider. °· Take your antibiotic medicine as told by your health care provider. Do not stop taking the antibiotic even if you start to feel better. °· Keep all pre-birth (prenatal) visits and follow-up visits as told by your health care provider. This is important. °Contact a health care provider if: °· You have pain or burning when you urinate. °· You have to urinate more often than usual. °· You have a fever or chills. °· You develop a bad-smelling vaginal discharge. °Get help right away if: °· Your water breaks. °· You go into labor. °· You have severe pain in your abdomen. °· You have difficulty breathing. °· You have chest pain. °These symptoms may represent a serious problem that is an emergency. Do not wait to see if the symptoms will go away. Get medical help right away. Call your local emergency services (911 in the [place] Do not drive yourself to   the hospital. °Summary °· GBS is a type of bacteria that is common in healthy people. °· During pregnancy, colonization with GBS can cause serious complications for you or your baby. °· Your health care provider will screen you between 35 and 37 weeks of pregnancy to determine if you are colonized with GBS. °· If you are colonized with GBS during  pregnancy, your health care provider will recommend antibiotics through an IV during labor. °· After delivery, your baby will be evaluated for complications related to potential GBS infection and may require antibiotics to prevent a serious infection. °This information is not intended to replace advice given to you by your health care provider. Make sure you discuss any questions you have with your health care provider. °Document Revised: 11/20/2018 Document Reviewed: 11/20/2018 °Elsevier Patient Education © 2020 [employer] Inc. ° °

## 2019-08-14 ENCOUNTER — Observation Stay
Admission: EM | Admit: 2019-08-14 | Discharge: 2019-08-14 | Disposition: A | Discharge status: Home / Self Care | Payer: BC Managed Care – PPO | Attending: Certified Nurse Midwife | Admitting: Certified Nurse Midwife

## 2019-08-14 ENCOUNTER — Telehealth: Payer: Self-pay | Admitting: Certified Nurse Midwife

## 2019-08-14 ENCOUNTER — Other Ambulatory Visit: Payer: Self-pay

## 2019-08-14 ENCOUNTER — Telehealth: Payer: Self-pay

## 2019-08-14 ENCOUNTER — Encounter: Payer: Self-pay | Admitting: Obstetrics and Gynecology

## 2019-08-14 DIAGNOSIS — Z3A37 37 weeks gestation of pregnancy: Secondary | ICD-10-CM | POA: Diagnosis not present

## 2019-08-14 DIAGNOSIS — Z3483 Encounter for supervision of other normal pregnancy, third trimester: Principal | ICD-10-CM | POA: Insufficient documentation

## 2019-08-14 DIAGNOSIS — O479 False labor, unspecified: Secondary | ICD-10-CM

## 2019-08-14 LAB — RUPTURE OF MEMBRANE (ROM)PLUS: Rom Plus: NEGATIVE

## 2019-08-14 MED ORDER — PROMETHAZINE HCL 25 MG/ML IJ SOLN: 12.5000 mg | Freq: Four times a day (QID) | INTRAMUSCULAR | Status: DC | PRN

## 2019-08-14 MED ORDER — MORPHINE SULFATE (PF) 4 MG/ML IV SOLN: 4.0000 mg | INTRAVENOUS | Status: DC | PRN

## 2019-08-14 MED ORDER — LIDOCAINE 5 % EX PTCH: 1.0000 | MEDICATED_PATCH | Freq: Every day | CUTANEOUS | Status: DC | PRN

## 2019-08-14 NOTE — Progress Notes (Signed)
08/14/2019 7:59 PM  BP 126/85 (BP Location: Left Arm)   Pulse 91   Temp 98.3 F (36.8 C) (Oral)   Resp 18   Ht 5\' 10"  (177.8 cm)   Wt 127 kg   LMP 11/24/2018   BMI 40.18 kg/m  Patient discharged per MD orders. Discharge instructions reviewed with patient and patient verbalized understanding. Discharged ambulatory escorted by husband.  11/26/2018, RN

## 2019-08-14 NOTE — Progress Notes (Signed)
Patient removed from fetal monitoring per provider order to complete the miles circuit as patient is complaining of lower back pain. Once miles circuit is complete, pt will be placed back on fetal monitoring and RN will reevaluate cervical exam. Pt declined pain medicine. Will continue to monitor.

## 2019-08-14 NOTE — Telephone Encounter (Signed)
Mychart message sent to patient.

## 2019-08-14 NOTE — OB Triage Note (Signed)
Pt presents c/o ctx that started off and on this weekend. Pt states she has an intense amount of perineal pressure as well. Pt had to wear a pad this morning because her panties were "soaking wet". The patient thought it may be sweat because she was hot but wasn't sure because she has had some trickling. Denies bleeding. Reports positive fetal movement. Vitals WNL. Will continue to monitor.

## 2019-08-14 NOTE — Progress Notes (Signed)
Patient removed from fetal monitoring per provider to walk and utilize the birthing ball. Will reapply monitors in 2 hours.

## 2019-08-14 NOTE — Discharge Instructions (Signed)
Fetal Movement Counts Patient Name: ________________________________________________ Patient Due Date: ____________________ What is a fetal movement count?  A fetal movement count is the number of times that you feel your baby move during a certain amount of time. This may also be called a fetal kick count. A fetal movement count is recommended for every pregnant woman. You may be asked to start counting fetal movements as early as week 28 of your pregnancy. Pay attention to when your baby is most active. You may notice your baby's sleep and wake cycles. You may also notice things that make your baby move more. You should do a fetal movement count:  When your baby is normally most active.  At the same time each day. A good time to count movements is while you are resting, after having something to eat and drink. How do I count fetal movements? 1. Find a quiet, comfortable area. Sit, or lie down on your side. 2. Write down the date, the start time and stop time, and the number of movements that you felt between those two times. Take this information with you to your health care visits. 3. Write down your start time when you feel the first movement. 4. Count kicks, flutters, swishes, rolls, and jabs. You should feel at least 10 movements. 5. You may stop counting after you have felt 10 movements, or if you have been counting for 2 hours. Write down the stop time. 6. If you do not feel 10 movements in 2 hours, contact your health care provider for further instructions. Your health care provider may want to do additional tests to assess your baby's well-being. Contact a health care provider if:  You feel fewer than 10 movements in 2 hours.  Your baby is not moving like he or she usually does. Date: ____________ Start time: ____________ Stop time: ____________ Movements: ____________ Date: ____________ Start time: ____________ Stop time: ____________ Movements: ____________ Date: ____________  Start time: ____________ Stop time: ____________ Movements: ____________ Date: ____________ Start time: ____________ Stop time: ____________ Movements: ____________ Date: ____________ Start time: ____________ Stop time: ____________ Movements: ____________ Date: ____________ Start time: ____________ Stop time: ____________ Movements: ____________ Date: ____________ Start time: ____________ Stop time: ____________ Movements: ____________ Date: ____________ Start time: ____________ Stop time: ____________ Movements: ____________ Date: ____________ Start time: ____________ Stop time: ____________ Movements: ____________ This information is not intended to replace advice given to you by your health care provider. Make sure you discuss any questions you have with your health care provider. Document Revised: 12/14/2018 Document Reviewed: 12/14/2018 Elsevier Patient Education  [place].   Pain Relief During Labor and Delivery Many things can cause pain during labor and delivery, including:  Pressure on bones and ligaments due to the baby moving through the pelvis.  Stretching of tissues due to the baby moving through the birth canal.  Muscle tension due to anxiety or nervousness.  The uterus tightening (contracting) and relaxing to help move the baby. There are many ways to deal with the pain of labor and delivery. They include:  Taking prenatal classes. Taking these classes helps you know what to expect during your baby's birth. What you learn will increase your confidence and decrease your anxiety.  Practicing relaxation techniques or doing relaxing activities, such as: ? Focused breathing. ? Meditation. ? Visualization. ? Aroma therapy. ? Listening to your favorite music. ? Hypnosis.  Taking a warm shower or bath (hydrotherapy). This may: ? Provide comfort and relaxation. ? Lessen your perception of pain. ?  Decrease the amount of pain medicine needed. ? Decrease the length  of labor.  Getting a massage or counterpressure on your back.  Applying warm packs or ice packs.  Changing positions often, moving around, or using a birthing ball.  Getting: ? Pain medicine through an IV or injection into a muscle. ? Pain medicine inserted into your spinal column. ? Injections of sterile water just under the skin on your lower back (intradermal injections). ? Laughing gas (nitrous oxide). Discuss your pain control options with your health care provider during your prenatal visits. Explore the options offered by your hospital or birth center. What kinds of medicine are available? There are two kinds of medicines that can be used to relieve pain during labor and delivery:  Analgesics. These medicines decrease pain without causing you to lose feeling or the ability to move your muscles.  Anesthetics. These medicines block feeling in the body and can decrease your ability to move freely. Both of these kinds of medicine can cause minor side effects, such as nausea, trouble concentrating, and sleepiness. They can also decrease the baby's heart rate before birth and affect the baby's breathing rate after birth. For this reason, health care providers are careful about when and how much medicine is given. What are specific medicines and procedures that provide pain relief? Local Anesthetics Local anesthetics are used to numb a small area of the body. They may be used along with another kind of anesthetic or used to numb the nerves of the vagina, cervix, and perineum during the second stage of labor. General Anesthetics General anesthetics cause you to lose consciousness so you do not feel pain. They are usually only used for an emergency cesarean delivery. General anesthetics are given through an IV tube and a mask. Pudendal Block A pudendal block is a form of local anesthetic. It may be used to relieve the pain associated with pushing or stretching of the perineum at the time of  delivery or to further numb the perineum. A pudendal block is done by injecting numbing medicine through the vaginal wall into a nerve in the pelvis. Epidural Analgesia Epidural analgesia is given through a flexible IV catheter that is inserted into the lower back. Numbing medicine is delivered continuously to the area near your spinal column nerves (epidural space). After having this type of analgesia, you may be able to move your legs but you most likely will not be able to walk. Depending on the amount of medicine given, you may lose all feeling in the lower half of your body, or you may retain some level of sensation, including the urge to push. Epidural analgesia can be used to provide pain relief for a vaginal birth. Spinal Block A spinal block is similar to epidural analgesia, but the medicine is injected into the spinal fluid instead of the epidural space. A spinal block is only given once. It starts to relieve pain quickly, but the pain relief lasts only 1-6 hours. Spinal blocks can be used for cesarean deliveries. Combined Spinal-Epidural (CSE) Block A CSE block combines the effects of a spinal block and epidural analgesia. The spinal block works quickly to block all pain. The epidural analgesia provides continuous pain relief, even after the effects of the spinal block have worn off. This information is not intended to replace advice given to you by your health care provider. Make sure you discuss any questions you have with your health care provider. Document Revised: 04/08/2017 Document Reviewed: 09/17/2015 Elsevier Patient Education  2020 [employer]   Vaginal Delivery  Vaginal delivery means that you give birth by pushing your baby out of your birth canal (vagina). A team of health care providers will help you before, during, and after vaginal delivery. Birth experiences are unique for every woman and every pregnancy, and birth experiences vary depending on where you choose to give  birth. What happens when I arrive at the birth center or hospital? Once you are in labor and have been admitted into the hospital or birth center, your health care provider may:  Review your pregnancy history and any concerns that you have.  Insert an IV into one of your veins. This may be used to give you fluids and medicines.  Check your blood pressure, pulse, temperature, and heart rate (vital signs).  Check whether your bag of water (amniotic sac) has broken (ruptured).  Talk with you about your birth plan and discuss pain control options. Monitoring Your health care provider may monitor your contractions (uterine monitoring) and your baby's heart rate (fetal monitoring). You may need to be monitored:  Often, but not continuously (intermittently).  All the time or for long periods at a time (continuously). Continuous monitoring may be needed if: ? You are taking certain medicines, such as medicine to relieve pain or make your contractions stronger. ? You have pregnancy or labor complications. Monitoring may be done by:  Placing a special stethoscope or a handheld monitoring device on your abdomen to check your baby's heartbeat and to check for contractions.  Placing monitors on your abdomen (external monitors) to record your baby's heartbeat and the frequency and length of contractions.  Placing monitors inside your uterus through your vagina (internal monitors) to record your baby's heartbeat and the frequency, length, and strength of your contractions. Depending on the type of monitor, it may remain in your uterus or on your baby's head until birth.  Telemetry. This is a type of continuous monitoring that can be done with external or internal monitors. Instead of having to stay in bed, you are able to move around during telemetry. Physical exam Your health care provider may perform frequent physical exams. This may include:  Checking how and where your baby is positioned in  your uterus.  Checking your cervix to determine: ? Whether it is thinning out (effacing). ? Whether it is opening up (dilating). What happens during labor and delivery?  Normal labor and delivery is divided into the following three stages: Stage 1  This is the longest stage of labor.  This stage can last for hours or days.  Throughout this stage, you will feel contractions. Contractions generally feel mild, infrequent, and irregular at first. They get stronger, more frequent (about every 2-3 minutes), and more regular as you move through this stage.  This stage ends when your cervix is completely dilated to 4 inches (10 cm) and completely effaced. Stage 2  This stage starts once your cervix is completely effaced and dilated and lasts until the delivery of your baby.  This stage may last from 20 minutes to 2 hours.  This is the stage where you will feel an urge to push your baby out of your vagina.  You may feel stretching and burning pain, especially when the widest part of your baby's head passes through the vaginal opening (crowning).  Once your baby is delivered, the umbilical cord will be clamped and cut. This usually occurs after waiting a period of 1-2 minutes after delivery.  Your baby will  be placed on your bare chest (skin-to-skin contact) in an upright position and covered with a warm blanket. Watch your baby for feeding cues, like rooting or sucking, and help the baby to your breast for his or her first feeding. Stage 3  This stage starts immediately after the birth of your baby and ends after you deliver the placenta.  This stage may take anywhere from 5 to 30 minutes.  After your baby has been delivered, you will feel contractions as your body expels the placenta and your uterus contracts to control bleeding. What can I expect after labor and delivery?  After labor is over, you and your baby will be monitored closely until you are ready to go home to ensure that  you are both healthy. Your health care team will teach you how to care for yourself and your baby.  You and your baby will stay in the same room (rooming in) during your hospital stay. This will encourage early bonding and successful breastfeeding.  You may continue to receive fluids and medicines through an IV.  Your uterus will be checked and massaged regularly (fundal massage).  You will have some soreness and pain in your abdomen, vagina, and the area of skin between your vaginal opening and your anus (perineum).  If an incision was made near your vagina (episiotomy) or if you had some vaginal tearing during delivery, cold compresses may be placed on your episiotomy or your tear. This helps to reduce pain and swelling.  You may be given a squirt bottle to use instead of wiping when you go to the bathroom. To use the squirt bottle, follow these steps: ? Before you urinate, fill the squirt bottle with warm water. Do not use hot water. ? After you urinate, while you are sitting on the toilet, use the squirt bottle to rinse the area around your urethra and vaginal opening. This rinses away any urine and blood. ? Fill the squirt bottle with clean water every time you use the bathroom.  It is normal to have vaginal bleeding after delivery. Wear a sanitary pad for vaginal bleeding and discharge. Summary  Vaginal delivery means that you will give birth by pushing your baby out of your birth canal (vagina).  Your health care provider may monitor your contractions (uterine monitoring) and your baby's heart rate (fetal monitoring).  Your health care provider may perform a physical exam.  Normal labor and delivery is divided into three stages.  After labor is over, you and your baby will be monitored closely until you are ready to go home. This information is not intended to replace advice given to you by your health care provider. Make sure you discuss any questions you have with your health  care provider. Document Revised: 05/31/2017 Document Reviewed: 05/31/2017 Elsevier Patient Education  2020 [employer].

## 2019-08-14 NOTE — Telephone Encounter (Signed)
Pt called in and stated that she is having pelvic pain and cramping. No bleeding, the pt stated she is having some contractions but they aren't constant. The pt was trying to be seen today. The pt was informed that a nurse will call or send a mychart. Please advise

## 2019-08-15 DIAGNOSIS — O479 False labor, unspecified: Secondary | ICD-10-CM

## 2019-08-15 NOTE — Discharge Summary (Signed)
Obstetric Discharge Summary  Patient ID: Kristi [name] MRN: [id number] DOB/AGE: 1986/03/28 34 y.o.   Date of Admission: 08/14/2019  Date of Discharge: 08/14/2019  Admitting Diagnosis: Observation at [redacted]w[redacted]d  Secondary Diagnosis: Varicella non-immune, History fourth degree laceration     Discharge Diagnosis: No other diagnosis   Antepartum Procedures: NST   Brief Hospital Course   L&D OB Triage Note  Kristi [name] is a 34 y.o. G30P1011 female at [redacted]w[redacted]d, EDD Estimated Date of Delivery: 08/31/19 who presented to triage for complaints of irregular contractions, perineal pressure and increased vaginal discharge.  She was evaluated by the nurses with no significant findings for active labor, fetal distress, or spontaneous rupture of membranes. Vital signs stable. An NST was performed and has been reviewed by CNM. She was given a copy of the [employer] to complete.   NST INTERPRETATION:  Indications: rule out uterine contractions  Mode: External Baseline Rate (A): 135 bpm(monitors removed) Variability: Moderate Accelerations: 15 x 15 Decelerations: None Contraction Frequency (min): occasional  Impression: reactive  Dilation: 3 Effacement (%): 50 Cervical Position: Middle Station: -2 Presentation: Vertex Exam by:: Helane Gunther RN   Lab Orders     ROM Plus ([facility] only) negative  Plan: NST performed was reviewed and was found to be reactive. She was discharged home with bleeding/labor precautions.  Continue routine prenatal care. Follow up with CNM as previously scheduled.    Discharge Instructions: Per After Visit Summary.  Activity: Also refer to After Visit Summary.  Diet: Regular  Medications: Allergies as of 08/14/2019   No Known Allergies     Medication List    TAKE these medications   cholecalciferol 25 MCG (1000 UNIT) tablet Commonly known as: VITAMIN D3 Take 1,000 Units by mouth daily.   levothyroxine 137 MCG tablet Commonly known as: SYNTHROID Take  137 mcg by mouth daily before breakfast.   prenatal multivitamin Tabs tablet Take 1 tablet by mouth daily at 12 noon.      Outpatient follow up:  Follow-up Information    [facility] Follow up.   Why: Follow up as previously scheduled or sooner if needed Contact information: [address]  [address] [place] [address] [phone]          Discharged Condition: stable  Discharged to: home   Gunnar Bulla, CNM [facility], [facility] 08/15/19 6:12 AM

## 2019-08-16 ENCOUNTER — Ambulatory Visit (INDEPENDENT_AMBULATORY_CARE_PROVIDER_SITE_OTHER): Payer: BC Managed Care – PPO | Admitting: Certified Nurse Midwife

## 2019-08-16 ENCOUNTER — Other Ambulatory Visit: Payer: Self-pay

## 2019-08-16 VITALS — BP 100/73 | HR 86 | Wt 280.0 lb

## 2019-08-16 DIAGNOSIS — Z3493 Encounter for supervision of normal pregnancy, unspecified, third trimester: Secondary | ICD-10-CM

## 2019-08-16 DIAGNOSIS — Z3483 Encounter for supervision of other normal pregnancy, third trimester: Secondary | ICD-10-CM

## 2019-08-16 DIAGNOSIS — Z3A37 37 weeks gestation of pregnancy: Secondary | ICD-10-CM

## 2019-08-16 LAB — POCT URINALYSIS DIPSTICK OB
Bilirubin, UA: NEGATIVE
Blood, UA: NEGATIVE
Glucose, UA: NEGATIVE
Ketones, UA: NEGATIVE
Leukocytes, UA: NEGATIVE
Nitrite, UA: NEGATIVE
POC,PROTEIN,UA: NEGATIVE
Spec Grav, UA: 1.025 (ref 1.010–1.025)
Urobilinogen, UA: 0.2 E.U./dL
pH, UA: 5 (ref 5.0–8.0)

## 2019-08-16 NOTE — Progress Notes (Signed)
[name]-Doing well, ready to have baby. Discussed home labor preparation techniques. Request membrane sweep. SVE unchanged since previous visit. Anticipatory guidance regarding course of prenatal care. Reviewed red flag symptoms and when to call. RTC x 1 week for [name] or sooner if needed.

## 2019-08-16 NOTE — Patient Instructions (Signed)
Pain Relief During Labor and Delivery Many things can cause pain during labor and delivery, including:  Pressure on bones and ligaments due to the baby moving through the pelvis.  Stretching of tissues due to the baby moving through the birth canal.  Muscle tension due to anxiety or nervousness.  The uterus tightening (contracting) and relaxing to help move the baby. There are many ways to deal with the pain of labor and delivery. They include:  Taking prenatal classes. Taking these classes helps you know what to expect during your baby's birth. What you learn will increase your confidence and decrease your anxiety.  Practicing relaxation techniques or doing relaxing activities, such as: ? Focused breathing. ? Meditation. ? Visualization. ? Aroma therapy. ? Listening to your favorite music. ? Hypnosis.  Taking a warm shower or bath (hydrotherapy). This may: ? Provide comfort and relaxation. ? Lessen your perception of pain. ? Decrease the amount of pain medicine needed. ? Decrease the length of labor.  Getting a massage or counterpressure on your back.  Applying warm packs or ice packs.  Changing positions often, moving around, or using a birthing ball.  Getting: ? Pain medicine through an IV or injection into a muscle. ? Pain medicine inserted into your spinal column. ? Injections of sterile water just under the skin on your lower back (intradermal injections). ? Laughing gas (nitrous oxide). Discuss your pain control options with your health care provider during your prenatal visits. Explore the options offered by your hospital or birth center. What kinds of medicine are available? There are two kinds of medicines that can be used to relieve pain during labor and delivery:  Analgesics. These medicines decrease pain without causing you to lose feeling or the ability to move your muscles.  Anesthetics. These medicines block feeling in the body and can decrease your  ability to move freely. Both of these kinds of medicine can cause minor side effects, such as nausea, trouble concentrating, and sleepiness. They can also decrease the baby's heart rate before birth and affect the baby's breathing rate after birth. For this reason, health care providers are careful about when and how much medicine is given. What are specific medicines and procedures that provide pain relief? Local Anesthetics Local anesthetics are used to numb a small area of the body. They may be used along with another kind of anesthetic or used to numb the nerves of the vagina, cervix, and perineum during the second stage of labor. General Anesthetics General anesthetics cause you to lose consciousness so you do not feel pain. They are usually only used for an emergency cesarean delivery. General anesthetics are given through an IV tube and a mask. Pudendal Block A pudendal block is a form of local anesthetic. It may be used to relieve the pain associated with pushing or stretching of the perineum at the time of delivery or to further numb the perineum. A pudendal block is done by injecting numbing medicine through the vaginal wall into a nerve in the pelvis. Epidural Analgesia Epidural analgesia is given through a flexible IV catheter that is inserted into the lower back. Numbing medicine is delivered continuously to the area near your spinal column nerves (epidural space). After having this type of analgesia, you may be able to move your legs but you most likely will not be able to walk. Depending on the amount of medicine given, you may lose all feeling in the lower half of your body, or you may retain some level   of sensation, including the urge to push. Epidural analgesia can be used to provide pain relief for a vaginal birth. Spinal Block A spinal block is similar to epidural analgesia, but the medicine is injected into the spinal fluid instead of the epidural space. A spinal block is only given  once. It starts to relieve pain quickly, but the pain relief lasts only 1-6 hours. Spinal blocks can be used for cesarean deliveries. Combined Spinal-Epidural (CSE) Block A CSE block combines the effects of a spinal block and epidural analgesia. The spinal block works quickly to block all pain. The epidural analgesia provides continuous pain relief, even after the effects of the spinal block have worn off. This information is not intended to replace advice given to you by your health care provider. Make sure you discuss any questions you have with your health care provider. Document Revised: 04/08/2017 Document Reviewed: 09/17/2015 Elsevier Patient Education  2020 [employer] Vaginal Delivery  Vaginal delivery means that you give birth by pushing your baby out of your birth canal (vagina). A team of health care providers will help you before, during, and after vaginal delivery. Birth experiences are unique for every woman and every pregnancy, and birth experiences vary depending on where you choose to give birth. What happens when I arrive at the birth center or hospital? Once you are in labor and have been admitted into the hospital or birth center, your health care provider may:  Review your pregnancy history and any concerns that you have.  Insert an IV into one of your veins. This may be used to give you fluids and medicines.  Check your blood pressure, pulse, temperature, and heart rate (vital signs).  Check whether your bag of water (amniotic sac) has broken (ruptured).  Talk with you about your birth plan and discuss pain control options. Monitoring Your health care provider may monitor your contractions (uterine monitoring) and your baby's heart rate (fetal monitoring). You may need to be monitored:  Often, but not continuously (intermittently).  All the time or for long periods at a time (continuously). Continuous monitoring may be needed if: ? You are taking certain medicines,  such as medicine to relieve pain or make your contractions stronger. ? You have pregnancy or labor complications. Monitoring may be done by:  Placing a special stethoscope or a handheld monitoring device on your abdomen to check your baby's heartbeat and to check for contractions.  Placing monitors on your abdomen (external monitors) to record your baby's heartbeat and the frequency and length of contractions.  Placing monitors inside your uterus through your vagina (internal monitors) to record your baby's heartbeat and the frequency, length, and strength of your contractions. Depending on the type of monitor, it may remain in your uterus or on your baby's head until birth.  Telemetry. This is a type of continuous monitoring that can be done with external or internal monitors. Instead of having to stay in bed, you are able to move around during telemetry. Physical exam Your health care provider may perform frequent physical exams. This may include:  Checking how and where your baby is positioned in your uterus.  Checking your cervix to determine: ? Whether it is thinning out (effacing). ? Whether it is opening up (dilating). What happens during labor and delivery?  Normal labor and delivery is divided into the following three stages: Stage 1  This is the longest stage of labor.  This stage can last for hours or days.  Throughout this stage,   you will feel contractions. Contractions generally feel mild, infrequent, and irregular at first. They get stronger, more frequent (about every 2-3 minutes), and more regular as you move through this stage.  This stage ends when your cervix is completely dilated to 4 inches (10 cm) and completely effaced. Stage 2  This stage starts once your cervix is completely effaced and dilated and lasts until the delivery of your baby.  This stage may last from 20 minutes to 2 hours.  This is the stage where you will feel an urge to push your baby out of  your vagina.  You may feel stretching and burning pain, especially when the widest part of your baby's head passes through the vaginal opening (crowning).  Once your baby is delivered, the umbilical cord will be clamped and cut. This usually occurs after waiting a period of 1-2 minutes after delivery.  Your baby will be placed on your bare chest (skin-to-skin contact) in an upright position and covered with a warm blanket. Watch your baby for feeding cues, like rooting or sucking, and help the baby to your breast for his or her first feeding. Stage 3  This stage starts immediately after the birth of your baby and ends after you deliver the placenta.  This stage may take anywhere from 5 to 30 minutes.  After your baby has been delivered, you will feel contractions as your body expels the placenta and your uterus contracts to control bleeding. What can I expect after labor and delivery?  After labor is over, you and your baby will be monitored closely until you are ready to go home to ensure that you are both healthy. Your health care team will teach you how to care for yourself and your baby.  You and your baby will stay in the same room (rooming in) during your hospital stay. This will encourage early bonding and successful breastfeeding.  You may continue to receive fluids and medicines through an IV.  Your uterus will be checked and massaged regularly (fundal massage).  You will have some soreness and pain in your abdomen, vagina, and the area of skin between your vaginal opening and your anus (perineum).  If an incision was made near your vagina (episiotomy) or if you had some vaginal tearing during delivery, cold compresses may be placed on your episiotomy or your tear. This helps to reduce pain and swelling.  You may be given a squirt bottle to use instead of wiping when you go to the bathroom. To use the squirt bottle, follow these steps: ? Before you urinate, fill the squirt  bottle with warm water. Do not use hot water. ? After you urinate, while you are sitting on the toilet, use the squirt bottle to rinse the area around your urethra and vaginal opening. This rinses away any urine and blood. ? Fill the squirt bottle with clean water every time you use the bathroom.  It is normal to have vaginal bleeding after delivery. Wear a sanitary pad for vaginal bleeding and discharge. Summary  Vaginal delivery means that you will give birth by pushing your baby out of your birth canal (vagina).  Your health care provider may monitor your contractions (uterine monitoring) and your baby's heart rate (fetal monitoring).  Your health care provider may perform a physical exam.  Normal labor and delivery is divided into three stages.  After labor is over, you and your baby will be monitored closely until you are ready to go home. This information   is not intended to replace advice given to you by your health care provider. Make sure you discuss any questions you have with your health care provider. Document Revised: 05/31/2017 Document Reviewed: 05/31/2017 Elsevier Patient Education  2020 [employer] Inc[employer]  

## 2019-08-22 ENCOUNTER — Other Ambulatory Visit: Payer: Self-pay | Admitting: Certified Nurse Midwife

## 2019-08-22 ENCOUNTER — Encounter: Payer: Self-pay | Admitting: Certified Nurse Midwife

## 2019-08-22 ENCOUNTER — Other Ambulatory Visit: Payer: BC Managed Care – PPO

## 2019-08-22 ENCOUNTER — Ambulatory Visit (INDEPENDENT_AMBULATORY_CARE_PROVIDER_SITE_OTHER): Payer: BC Managed Care – PPO | Admitting: Certified Nurse Midwife

## 2019-08-22 ENCOUNTER — Other Ambulatory Visit: Payer: Self-pay

## 2019-08-22 ENCOUNTER — Other Ambulatory Visit (INDEPENDENT_AMBULATORY_CARE_PROVIDER_SITE_OTHER): Payer: BC Managed Care – PPO

## 2019-08-22 DIAGNOSIS — Z09 Encounter for follow-up examination after completed treatment for conditions other than malignant neoplasm: Secondary | ICD-10-CM

## 2019-08-22 DIAGNOSIS — Z3A38 38 weeks gestation of pregnancy: Secondary | ICD-10-CM

## 2019-08-22 DIAGNOSIS — O99213 Obesity complicating pregnancy, third trimester: Secondary | ICD-10-CM

## 2019-08-22 DIAGNOSIS — R519 Headache, unspecified: Secondary | ICD-10-CM

## 2019-08-22 NOTE — Patient Instructions (Signed)
Braxton Hicks Contractions °Contractions of the uterus can occur throughout pregnancy, but they are not always a sign that you are in labor. You may have practice contractions called Braxton Hicks contractions. These false labor contractions are sometimes confused with true labor. °What are Braxton Hicks contractions? °Braxton Hicks contractions are tightening movements that occur in the muscles of the uterus before labor. Unlike true labor contractions, these contractions do not result in opening (dilation) and thinning of the cervix. Toward the end of pregnancy (32-34 weeks), Braxton Hicks contractions can happen more often and may become stronger. These contractions are sometimes difficult to tell apart from true labor because they can be very uncomfortable. You should not feel embarrassed if you go to the hospital with false labor. °Sometimes, the only way to tell if you are in true labor is for your health care provider to look for changes in the cervix. The health care provider will do a physical exam and may monitor your contractions. If you are not in true labor, the exam should show that your cervix is not dilating and your water has not broken. °If there are no other health problems associated with your pregnancy, it is completely safe for you to be sent home with false labor. You may continue to have Braxton Hicks contractions until you go into true labor. °How to tell the difference between true labor and false labor °True labor °· Contractions last 30-70 seconds. °· Contractions become very regular. °· Discomfort is usually felt in the top of the uterus, and it spreads to the lower abdomen and low back. °· Contractions do not go away with walking. °· Contractions usually become more intense and increase in frequency. °· The cervix dilates and gets thinner. °False labor °· Contractions are usually shorter and not as strong as true labor contractions. °· Contractions are usually irregular. °· Contractions  are often felt in the front of the lower abdomen and in the groin. °· Contractions may go away when you walk around or change positions while lying down. °· Contractions get weaker and are shorter-lasting as time goes on. °· The cervix usually does not dilate or become thin. °Follow these instructions at home: ° °· Take over-the-counter and prescription medicines only as told by your health care provider. °· Keep up with your usual exercises and follow other instructions from your health care provider. °· Eat and drink lightly if you think you are going into labor. °· If Braxton Hicks contractions are making you uncomfortable: °? Change your position from lying down or resting to walking, or change from walking to resting. °? Sit and rest in a tub of warm water. °? Drink enough fluid to keep your urine pale yellow. Dehydration may cause these contractions. °? Do slow and deep breathing several times an hour. °· Keep all follow-up prenatal visits as told by your health care provider. This is important. °Contact a health care provider if: °· You have a fever. °· You have continuous pain in your abdomen. °Get help right away if: °· Your contractions become stronger, more regular, and closer together. °· You have fluid leaking or gushing from your vagina. °· You pass blood-tinged mucus (bloody show). °· You have bleeding from your vagina. °· You have low back pain that you never had before. °· You feel your baby’s head pushing down and causing pelvic pressure. °· Your baby is not moving inside you as much as it used to. °Summary °· Contractions that occur before labor are   called Braxton Hicks contractions, false labor, or practice contractions. °· Braxton Hicks contractions are usually shorter, weaker, farther apart, and less regular than true labor contractions. True labor contractions usually become progressively stronger and regular, and they become more frequent. °· Manage discomfort from Braxton Hicks contractions  by changing position, resting in a warm bath, drinking plenty of water, or practicing deep breathing. °This information is not intended to replace advice given to you by your health care provider. Make sure you discuss any questions you have with your health care provider. °Document Revised: 04/08/2017 Document Reviewed: 09/09/2016 °Elsevier Patient Education © 2020 Elsevier Inc. ° °

## 2019-08-22 NOTE — Progress Notes (Signed)
[name] doing well. Feels good movement. Pt is very anxious about size of baby due to her last delivery with 4th degree . U/s today for growth. Pt has been contimplating primary c/section due to birth trama . U/s today shows 78th% 8lbs 5oz. ( see below). Discussed plan of care. Body mass index is 41.05 kg/m. Pt scheduled for induction on 4/19 @0000 . She will have her covid testing on Friday. Labor symptoms reviewed.    NST: elevated BMI  Baseline: 130 Moderate variability  Accelerations:present Decelerations: absent   Toco: none Thursday, CNM    Patient Name: Kristi [name] DOB: March 12, 1986 MRN: [id number] ULTRASOUND REPORT  Location: [facility] Date of Service: 08/22/2019   Indications:growth/afi Findings:  08/24/2019 intrauterine pregnancy is visualized with FHR at 137 BPM. Biometrics give an (U/S) Gestational age of [redacted]w[redacted]d and an (U/S) EDD of 08/24/2019; this correlates with the clinically established Estimated Date of Delivery: 08/31/19.  Fetal presentation is Cephalic.  Placenta: posterior. Grade: 1 AFI: 10.3 cm  Growth percentile is 78. EFW: 3759 g ( 8 lbs 5 oz)   Impression: 1. [redacted]w[redacted]d Viable Singleton Intrauterine pregnancy previously established criteria. 2. Growth is 78 %ile.  AFI is 10.3 cm.   Recommendations: 1.Clinical correlation with the patient's History and Physical Exam.   Jenine  M. [redacted]w[redacted]d     RDMS

## 2019-08-23 LAB — POCT URINALYSIS DIPSTICK OB
Bilirubin, UA: NEGATIVE
Blood, UA: NEGATIVE
Glucose, UA: NEGATIVE
Ketones, UA: NEGATIVE
Leukocytes, UA: NEGATIVE
Nitrite, UA: NEGATIVE
POC,PROTEIN,UA: NEGATIVE
Spec Grav, UA: 1.02 (ref 1.010–1.025)
Urobilinogen, UA: 0.2 E.U./dL
pH, UA: 5 (ref 5.0–8.0)

## 2019-08-23 NOTE — Addendum Note (Signed)
Addended by: Brooke Dare on: 08/23/2019 08:46 AM   Modules accepted: Orders

## 2019-08-24 ENCOUNTER — Other Ambulatory Visit
Admission: RE | Admit: 2019-08-24 | Discharge: 2019-08-24 | Disposition: A | Discharge status: Home / Self Care | Payer: BC Managed Care – PPO | Source: Ambulatory Visit | Attending: Certified Nurse Midwife | Admitting: Certified Nurse Midwife

## 2019-08-24 DIAGNOSIS — Z20822 Contact with and (suspected) exposure to covid-19: Secondary | ICD-10-CM | POA: Insufficient documentation

## 2019-08-24 DIAGNOSIS — Z01812 Encounter for preprocedural laboratory examination: Secondary | ICD-10-CM | POA: Insufficient documentation

## 2019-08-24 LAB — SARS CORONAVIRUS 2 (TAT 6-24 HRS): SARS Coronavirus 2: NEGATIVE

## 2019-08-25 ENCOUNTER — Other Ambulatory Visit: Payer: Self-pay

## 2019-08-25 ENCOUNTER — Inpatient Hospital Stay: Payer: BC Managed Care – PPO | Admitting: Anesthesiology

## 2019-08-25 ENCOUNTER — Inpatient Hospital Stay
Admission: EM | Admit: 2019-08-25 | Discharge: 2019-08-26 | DRG: 807 | Disposition: A | Discharge status: Home / Self Care | Payer: BC Managed Care – PPO | Attending: Certified Nurse Midwife | Admitting: Certified Nurse Midwife

## 2019-08-25 ENCOUNTER — Encounter: Payer: Self-pay | Admitting: Certified Nurse Midwife

## 2019-08-25 DIAGNOSIS — Z3A39 39 weeks gestation of pregnancy: Secondary | ICD-10-CM | POA: Diagnosis not present

## 2019-08-25 DIAGNOSIS — Z8616 Personal history of COVID-19: Secondary | ICD-10-CM

## 2019-08-25 DIAGNOSIS — O99214 Obesity complicating childbirth: Principal | ICD-10-CM | POA: Diagnosis present

## 2019-08-25 DIAGNOSIS — E039 Hypothyroidism, unspecified: Secondary | ICD-10-CM | POA: Diagnosis present

## 2019-08-25 DIAGNOSIS — O26893 Other specified pregnancy related conditions, third trimester: Secondary | ICD-10-CM | POA: Diagnosis present

## 2019-08-25 DIAGNOSIS — O99284 Endocrine, nutritional and metabolic diseases complicating childbirth: Secondary | ICD-10-CM | POA: Diagnosis present

## 2019-08-25 DIAGNOSIS — Z20822 Contact with and (suspected) exposure to covid-19: Secondary | ICD-10-CM | POA: Diagnosis present

## 2019-08-25 LAB — CBC
HCT: 33.1 % — ABNORMAL LOW (ref 36.0–46.0)
Hemoglobin: 10.6 g/dL — ABNORMAL LOW (ref 12.0–15.0)
MCH: 27.2 pg (ref 26.0–34.0)
MCHC: 32 g/dL (ref 30.0–36.0)
MCV: 84.9 fL (ref 80.0–100.0)
Platelets: 209 10*3/uL (ref 150–400)
RBC: 3.9 MIL/uL (ref 3.87–5.11)
RDW: 14.5 % (ref 11.5–15.5)
WBC: 8.9 10*3/uL (ref 4.0–10.5)
nRBC: 0 % (ref 0.0–0.2)

## 2019-08-25 LAB — RPR: RPR Ser Ql: NONREACTIVE

## 2019-08-25 MED ORDER — ACETAMINOPHEN 325 MG PO TABS: 650.0000 mg | ORAL_TABLET | ORAL | Status: DC | PRN

## 2019-08-25 MED ORDER — LACTATED RINGERS IV SOLN: INTRAVENOUS | Status: DC

## 2019-08-25 MED ORDER — CARBOPROST TROMETHAMINE 250 MCG/ML IM SOLN
INTRAMUSCULAR | Status: AC
  Filled 2019-08-25: qty 1

## 2019-08-25 MED ORDER — DIPHENHYDRAMINE HCL 50 MG/ML IJ SOLN: 12.5000 mg | INTRAMUSCULAR | Status: DC | PRN

## 2019-08-25 MED ORDER — METHYLERGONOVINE MALEATE 0.2 MG PO TABS
0.2000 mg | ORAL_TABLET | ORAL | Status: DC | PRN
  Filled 2019-08-25: qty 1

## 2019-08-25 MED ORDER — SENNOSIDES-DOCUSATE SODIUM 8.6-50 MG PO TABS: 2.0000 | ORAL_TABLET | ORAL | Status: DC

## 2019-08-25 MED ORDER — FERROUS SULFATE 325 (65 FE) MG PO TABS
325.0000 mg | ORAL_TABLET | Freq: Every day | ORAL | Status: DC
  Administered 2019-08-26: 325 mg via ORAL
  Filled 2019-08-25: qty 1

## 2019-08-25 MED ORDER — LIDOCAINE HCL (PF) 1 % IJ SOLN
INTRAMUSCULAR | Status: DC | PRN
  Administered 2019-08-25: 2 mL
  Administered 2019-08-25: 1 mL

## 2019-08-25 MED ORDER — BUTORPHANOL TARTRATE 1 MG/ML IJ SOLN: 1.0000 mg | INTRAMUSCULAR | Status: DC | PRN

## 2019-08-25 MED ORDER — LOPERAMIDE HCL 2 MG PO CAPS
2.0000 mg | ORAL_CAPSULE | ORAL | Status: DC | PRN
  Filled 2019-08-25: qty 1

## 2019-08-25 MED ORDER — OXYTOCIN 40 UNITS IN NORMAL SALINE INFUSION - SIMPLE MED
1.0000 m[IU]/min | INTRAVENOUS | Status: DC
  Administered 2019-08-25: 09:00:00 4 m[IU]/min via INTRAVENOUS

## 2019-08-25 MED ORDER — SODIUM CHLORIDE 0.9 % IV SOLN
INTRAVENOUS | Status: DC | PRN
  Administered 2019-08-25 (×3): 5 mL via EPIDURAL

## 2019-08-25 MED ORDER — ONDANSETRON HCL 4 MG PO TABS: 4.0000 mg | ORAL_TABLET | ORAL | Status: DC | PRN

## 2019-08-25 MED ORDER — IBUPROFEN 600 MG PO TABS
600.0000 mg | ORAL_TABLET | Freq: Four times a day (QID) | ORAL | Status: DC
  Administered 2019-08-25 – 2019-08-26 (×4): 600 mg via ORAL
  Filled 2019-08-25 (×4): qty 1

## 2019-08-25 MED ORDER — LACTATED RINGERS IV SOLN: 500.0000 mL | INTRAVENOUS | Status: DC | PRN

## 2019-08-25 MED ORDER — WITCH HAZEL-GLYCERIN EX PADS: 1.0000 "application " | MEDICATED_PAD | CUTANEOUS | Status: DC | PRN

## 2019-08-25 MED ORDER — FENTANYL CITRATE (PF) 100 MCG/2ML IJ SOLN
100.0000 ug | Freq: Once | INTRAMUSCULAR | Status: AC
  Administered 2019-08-25: 50 ug via INTRAVENOUS

## 2019-08-25 MED ORDER — EPHEDRINE 5 MG/ML INJ: 10.0000 mg | INTRAVENOUS | Status: DC | PRN

## 2019-08-25 MED ORDER — FENTANYL 2.5 MCG/ML W/ROPIVACAINE 0.15% IN NS 100 ML EPIDURAL (ARMC)
EPIDURAL | Status: AC
  Filled 2019-08-25: qty 100

## 2019-08-25 MED ORDER — OXYTOCIN 40 UNITS IN NORMAL SALINE INFUSION - SIMPLE MED
INTRAVENOUS | Status: AC
  Administered 2019-08-25: 2.5 [IU]/h via INTRAVENOUS
  Filled 2019-08-25: qty 1000

## 2019-08-25 MED ORDER — METHYLERGONOVINE MALEATE 0.2 MG/ML IJ SOLN: 0.2000 mg | INTRAMUSCULAR | Status: DC | PRN

## 2019-08-25 MED ORDER — SOD CITRATE-CITRIC ACID 500-334 MG/5ML PO SOLN: 30.0000 mL | ORAL | Status: DC | PRN

## 2019-08-25 MED ORDER — TERBUTALINE SULFATE 1 MG/ML IJ SOLN: 0.2500 mg | Freq: Once | INTRAMUSCULAR | Status: DC | PRN

## 2019-08-25 MED ORDER — FENTANYL 2.5 MCG/ML W/ROPIVACAINE 0.15% IN NS 100 ML EPIDURAL (ARMC): 12.0000 mL/h | EPIDURAL | Status: DC

## 2019-08-25 MED ORDER — LIDOCAINE HCL (PF) 1 % IJ SOLN
INTRAMUSCULAR | Status: AC
  Filled 2019-08-25: qty 30

## 2019-08-25 MED ORDER — DIBUCAINE (PERIANAL) 1 % EX OINT: 1.0000 "application " | TOPICAL_OINTMENT | CUTANEOUS | Status: DC | PRN

## 2019-08-25 MED ORDER — ZOLPIDEM TARTRATE 5 MG PO TABS
5.0000 mg | ORAL_TABLET | Freq: Every evening | ORAL | Status: DC | PRN
  Administered 2019-08-25: 5 mg via ORAL
  Filled 2019-08-25: qty 1

## 2019-08-25 MED ORDER — CARBOPROST TROMETHAMINE 250 MCG/ML IM SOLN
250.0000 ug | Freq: Once | INTRAMUSCULAR | Status: AC
  Administered 2019-08-25: 250 ug via INTRAMUSCULAR

## 2019-08-25 MED ORDER — PHENYLEPHRINE 40 MCG/ML (10ML) SYRINGE FOR IV PUSH (FOR BLOOD PRESSURE SUPPORT)
80.0000 ug | PREFILLED_SYRINGE | INTRAVENOUS | Status: DC | PRN
  Filled 2019-08-25: qty 10

## 2019-08-25 MED ORDER — MISOPROSTOL 200 MCG PO TABS
ORAL_TABLET | ORAL | Status: AC
  Filled 2019-08-25: qty 5

## 2019-08-25 MED ORDER — METHYLERGONOVINE MALEATE 0.2 MG/ML IJ SOLN
INTRAMUSCULAR | Status: AC
  Filled 2019-08-25: qty 1

## 2019-08-25 MED ORDER — ACETAMINOPHEN 325 MG PO TABS
650.0000 mg | ORAL_TABLET | ORAL | Status: DC | PRN
  Administered 2019-08-25: 650 mg via ORAL
  Filled 2019-08-25: qty 2

## 2019-08-25 MED ORDER — SODIUM CHLORIDE 0.9% IV SOLUTION: Freq: Once | INTRAVENOUS | Status: DC

## 2019-08-25 MED ORDER — ONDANSETRON HCL 4 MG/2ML IJ SOLN: 4.0000 mg | INTRAMUSCULAR | Status: DC | PRN

## 2019-08-25 MED ORDER — METHYLERGONOVINE MALEATE 0.2 MG/ML IJ SOLN
0.2000 mg | Freq: Once | INTRAMUSCULAR | Status: AC
  Administered 2019-08-25: 0.2 mg via INTRAMUSCULAR

## 2019-08-25 MED ORDER — LOPERAMIDE HCL 2 MG PO CAPS
4.0000 mg | ORAL_CAPSULE | Freq: Once | ORAL | Status: AC
  Administered 2019-08-25: 4 mg via ORAL
  Filled 2019-08-25: qty 2

## 2019-08-25 MED ORDER — FENTANYL CITRATE (PF) 100 MCG/2ML IJ SOLN
INTRAMUSCULAR | Status: AC
  Administered 2019-08-25: 50 ug via INTRAVENOUS
  Filled 2019-08-25: qty 2

## 2019-08-25 MED ORDER — LACTATED RINGERS IV SOLN
500.0000 mL | Freq: Once | INTRAVENOUS | Status: AC
  Administered 2019-08-25: 500 mL via INTRAVENOUS

## 2019-08-25 MED ORDER — MISOPROSTOL 50MCG HALF TABLET
50.0000 ug | ORAL_TABLET | ORAL | Status: DC
  Administered 2019-08-25: 50 ug via VAGINAL
  Filled 2019-08-25 (×2): qty 1

## 2019-08-25 MED ORDER — FENTANYL 2.5 MCG/ML W/ROPIVACAINE 0.15% IN NS 100 ML EPIDURAL (ARMC)
EPIDURAL | Status: DC | PRN
  Administered 2019-08-25: 12 mL/h via EPIDURAL

## 2019-08-25 MED ORDER — LIDOCAINE HCL (PF) 1 % IJ SOLN: 30.0000 mL | INTRAMUSCULAR | Status: DC | PRN

## 2019-08-25 MED ORDER — AMMONIA AROMATIC IN INHA
RESPIRATORY_TRACT | Status: AC
  Filled 2019-08-25: qty 10

## 2019-08-25 MED ORDER — MISOPROSTOL 200 MCG PO TABS: 800.0000 ug | ORAL_TABLET | Freq: Once | ORAL | Status: AC

## 2019-08-25 MED ORDER — ONDANSETRON HCL 4 MG/2ML IJ SOLN: 4.0000 mg | Freq: Four times a day (QID) | INTRAMUSCULAR | Status: DC | PRN

## 2019-08-25 MED ORDER — BENZOCAINE-MENTHOL 20-0.5 % EX AERO
1.0000 "application " | INHALATION_SPRAY | CUTANEOUS | Status: DC | PRN
  Administered 2019-08-25: 1 via TOPICAL
  Filled 2019-08-25: qty 56

## 2019-08-25 MED ORDER — COCONUT OIL OIL: 1.0000 "application " | TOPICAL_OIL | Status: DC | PRN

## 2019-08-25 MED ORDER — MISOPROSTOL 200 MCG PO TABS
ORAL_TABLET | ORAL | Status: AC
  Administered 2019-08-25: 800 ug via RECTAL
  Filled 2019-08-25: qty 4

## 2019-08-25 MED ORDER — MISOPROSTOL 200 MCG PO TABS
ORAL_TABLET | ORAL | Status: AC
  Administered 2019-08-25: 50 ug via VAGINAL
  Filled 2019-08-25: qty 4

## 2019-08-25 MED ORDER — OXYTOCIN 40 UNITS IN NORMAL SALINE INFUSION - SIMPLE MED
2.5000 [IU]/h | INTRAVENOUS | Status: DC
  Administered 2019-08-25: 2.5 [IU]/h via INTRAVENOUS
  Filled 2019-08-25: qty 1000

## 2019-08-25 MED ORDER — DOCUSATE SODIUM 100 MG PO CAPS
100.0000 mg | ORAL_CAPSULE | Freq: Two times a day (BID) | ORAL | Status: DC
  Administered 2019-08-26: 100 mg via ORAL
  Filled 2019-08-25: qty 1

## 2019-08-25 MED ORDER — OXYTOCIN 10 UNIT/ML IJ SOLN
INTRAMUSCULAR | Status: AC
  Filled 2019-08-25: qty 2

## 2019-08-25 MED ORDER — SIMETHICONE 80 MG PO CHEW: 80.0000 mg | CHEWABLE_TABLET | ORAL | Status: DC | PRN

## 2019-08-25 MED ORDER — OXYTOCIN BOLUS FROM INFUSION
500.0000 mL | Freq: Once | INTRAVENOUS | Status: AC
  Administered 2019-08-25: 500 mL via INTRAVENOUS

## 2019-08-25 MED ORDER — PRENATAL MULTIVITAMIN CH
1.0000 | ORAL_TABLET | Freq: Every day | ORAL | Status: DC
  Administered 2019-08-26: 1 via ORAL
  Filled 2019-08-25: qty 1

## 2019-08-25 NOTE — Progress Notes (Signed)
LABOR NOTE   Kristi [name] 34 y.o.@ at [redacted]w[redacted]d  SUBJECTIVE:  Uncomfortable with contractions, requesting epidural  Analgesia: planning  OBJECTIVE:  BP 130/87   Pulse 71   Temp 98 F (36.7 C) (Oral)   Resp 18   Ht 5\' 10"  (1.778 m)   Wt 129.7 kg   LMP 11/24/2018   BMI 41.04 kg/m  No intake/output data recorded.  She has shown cervical change. CERVIX: 5:  90%:   -2:   mid position:   soft SVE:   Dilation: 5 Effacement (%): 90 Station: -2 Exam by:: A Olevia Westervelt CNM CONTRACTIONS: regular, every 2-3 minutes FHR: Fetal heart tracing reviewed. Baseline: 130 bpm, Variability: Good {> 6 bpm), Accelerations: Reactive and Decelerations: Absent Category I  Labs: Lab Results  Component Value Date   WBC 8.9 08/25/2019   HGB 10.6 (L) 08/25/2019   HCT 33.1 (L) 08/25/2019   MCV 84.9 08/25/2019   PLT 209 08/25/2019    ASSESSMENT: 1) Labor curve reviewed.       Progress: latent labor     Membranes: ruptured, clear fluid          Active Problems:   Labor and delivery, indication for care   PLAN: continue present management and IV Pitocin induction   08/27/2019, CNM 08/25/2019 11:03 AM

## 2019-08-25 NOTE — Anesthesia Preprocedure Evaluation (Signed)
Anesthesia Evaluation  Patient identified by MRN, date of birth, ID band Patient awake    Reviewed: Allergy & Precautions, H&P , NPO status , Patient's Chart, lab work & pertinent test results  Airway Mallampati: III  TM Distance: >3 FB Neck ROM: full    Dental  (+) Teeth Intact   Pulmonary neg pulmonary ROS,           Cardiovascular Exercise Tolerance: Good (-) hypertensionnegative cardio ROS       Neuro/Psych    GI/Hepatic GERD  ,  Endo/Other  Hypothyroidism Morbid obesity  Renal/GU   negative genitourinary   Musculoskeletal   Abdominal   Peds  Hematology negative hematology ROS (+)   Anesthesia Other Findings Past Medical History: No date: Acid reflux No date: Hypothyroidism No date: Obesity 2011: Pap smear, low-risk  History reviewed. No pertinent surgical history.  BMI    Body Mass Index: 41.04 kg/m      Reproductive/Obstetrics (+) Pregnancy                             Anesthesia Physical Anesthesia Plan  ASA: II  Anesthesia Plan: Epidural   Post-op Pain Management:    Induction:   PONV Risk Score and Plan:   Airway Management Planned:   Additional Equipment:   Intra-op Plan:   Post-operative Plan:   Informed Consent: I have reviewed the patients History and Physical, chart, labs and discussed the procedure including the risks, benefits and alternatives for the proposed anesthesia with the patient or authorized representative who has indicated his/her understanding and acceptance.       Plan Discussed with: Anesthesiologist  Anesthesia Plan Comments:         Anesthesia Quick Evaluation

## 2019-08-25 NOTE — Progress Notes (Signed)
Progress Note - Vaginal Delivery  Kristi [name] is a 34 y.o. [id number] now PP day 0 s/p Vaginal, Spontaneous  @ 1559 Called by RN approximately 30-40 min after delivery for concerns of passing clots. Methergine ordered. Called in to check on pt ( 15 min later)-RN stated that no longer passing clots and bleeding better. 1923 received call from RN stating that fundus was firm and that pt was not passing clots but she was saturating pads estimated blood loss 900 ml.. That she was symptomatic with feeling light headed when getting up to bathroom. Rectal Cytotec ordered. Returned to L&D for further evaluation.   Subjective:  The patient reports no complaints  Objective:  Vital signs in last 24 hours: Temp:  [97.7 F (36.5 C)-98.4 F (36.9 C)] 98.4 F (36.9 C) (04/17 1918) Pulse Rate:  [65-86] 78 (04/17 1918) Resp:  [18-20] 20 (04/17 1918) BP: (111-135)/(72-89) 112/89 (04/17 1918) SpO2:  [99 %-100 %] 99 % (04/17 1730) Weight:  [129.7 kg] 129.7 kg (04/17 0019)  Physical Exam:  General: alert, cooperative, appears stated age, morbidly obese and pale Lochia: mild to moderate flow, no clots on exam , Bimanual completed (difficult due to body habitus) several small clots removed ( approximately 250 ml).  Uterine Fundus: firm @ u.     Data Review Recent Labs    08/25/19 0047  HGB 10.6*  HCT 33.1*    Assessment/Plan: Active Problems:   Labor and delivery, indication for care   Hemabate ordered for continued bleeding, continue to observe.   -- Continue routine PP care.     Doreene Burke, CNM  08/25/2019 7:54 PM

## 2019-08-25 NOTE — Progress Notes (Signed)
LABOR NOTE   Kristi [name] 34 y.o.@ at [redacted]w[redacted]d  SUBJECTIVE:  Minimal pressure. Comfortable with epidural Analgesia: Epidural  OBJECTIVE:  BP 125/85   Pulse 74   Temp 97.7 F (36.5 C) (Oral)   Resp 18   Ht 5\' 10"  (1.778 m)   Wt 129.7 kg   LMP 11/24/2018   SpO2 99%   Breastfeeding Unknown   BMI 41.04 kg/m  Total I/O In: -  Out: 40 [Urine:40]  She has shown cervical change. CERVIX: 10:  100%:   -1:   SVE:   Dilation: 10 Effacement (%): 100 Station: -1 Exam by:: A Terrionna Bridwell CNM CONTRACTIONS: regular, every 3 minutes FHR: Fetal heart tracing reviewed. Baseline: 130 bpm, Variability: Good {> 6 bpm), Accelerations: Reactive and Decelerations: variable  Category II   Labs: Lab Results  Component Value Date   WBC 8.9 08/25/2019   HGB 10.6 (L) 08/25/2019   HCT 33.1 (L) 08/25/2019   MCV 84.9 08/25/2019   PLT 209 08/25/2019    ASSESSMENT: 1) Labor curve reviewed.       Progress: Active phase labor.     Membranes: ruptured, clear fluid          2Active Problems:   Labor and delivery, indication for care   PLAN: Start pushing   08/27/2019, Doreene Burke  08/25/2019 4:22 PM

## 2019-08-25 NOTE — Progress Notes (Signed)
Pt presents to L&D for scheduled IOL. Pt denies leaking of fluid, or vaginal bleeding.Pt states positive fetal movement. A.Thompson, CNM aware of pt, orders released.

## 2019-08-25 NOTE — H&P (Signed)
History and Physical   HPI  Kristi [name] is a 34 y.o. G3P1011 at [redacted]w[redacted]d Estimated Date of Delivery: 08/31/19 who is being admitted for induction of labor due to elevated BMI in pregnancy.    OB History  OB History  Gravida Para Term Preterm AB Living  3 1 1  0 1 1  SAB TAB Ectopic Multiple Live Births  1 0 0 0 1    # Outcome Date GA Lbr Len/2nd Weight Sex Delivery Anes PTL Lv  3 Current           2 Term 05/15/14 [redacted]w[redacted]d / 04:30 4167 g M Vag-Vacuum EPI N LIV     Birth Comments: hematoma from vacuum, jaundice     Complications: Dysfunctional Labor  1 SAB 2015            PROBLEM LIST  Pregnancy complications or risks: Patient Active Problem List   Diagnosis Date Noted  . Labor and delivery, indication for care 08/25/2019  . Uterine contractions during pregnancy   . Indication for care in labor and delivery, antepartum 08/14/2019  . Obstetrical laceration, fourth degree 05/24/2019  . History of 2019 novel coronavirus disease (COVID-19) 05/24/2019  . Maternal varicella, non-immune 02/20/2019  . Vitamin D deficiency disease 09/03/2014  . Hypothyroidism 01/22/2011    Prenatal labs and studies: ABO, Rh: --/--/O POS (03/11 2038) Antibody: NEG (03/11 2038) Rubella: 1.60 (09/17 1204) RPR: Non Reactive (02/08 0820)  HBsAg: Negative (09/17 1204)  HIV: Non Reactive (09/17 1204)  YHC:WCBJSEGB/-- (03/25 1107)   Past Medical History:  Diagnosis Date  . Acid reflux   . Hypothyroidism   . Obesity   . Pap smear, low-risk 2011     History reviewed. No pertinent surgical history.   Medications    Current Discharge Medication List    CONTINUE these medications which have NOT CHANGED   Details  cholecalciferol (VITAMIN D3) 25 MCG (1000 UNIT) tablet Take 1,000 Units by mouth daily.    levothyroxine (SYNTHROID) 137 MCG tablet Take 137 mcg by mouth daily before breakfast.    Prenatal Vit-Fe Fumarate-FA (PRENATAL MULTIVITAMIN) TABS tablet Take 1 tablet by mouth  daily at 12 noon.         Allergies  Patient has no known allergies.  Review of Systems  Constitutional: negative Eyes: negative Ears, nose, mouth, throat, and face: negative Respiratory: negative Cardiovascular: negative Gastrointestinal: negative Genitourinary:negative Integument/breast: negative Hematologic/lymphatic: negative Musculoskeletal:negative Neurological: negative Behavioral/Psych: negative Endocrine: negative Allergic/Immunologic: negative  Physical Exam  BP 134/88 (BP Location: Left Arm)   Pulse 84   Temp 97.9 F (36.6 C) (Oral)   Resp 18   Ht 5\' 10"  (1.778 m)   Wt 129.7 kg   LMP 11/24/2018   BMI 41.04 kg/m   Lungs:  CTA B Cardio: RRR  Abd: Soft, gravid, NT Presentation: cephalic EXT: No C/C/ 1+ Edema DTRs: 2+ B CERVIX:    See Prenatal records for more detailed PE.     FHR:  Baseline: 120 bpm, Variability: Good {> 6 bpm), Accelerations: Reactive and Decelerations: Absent  Toco: Uterine Contractions: Frequency: irregular, mild   Test Results  Results for orders placed or performed during the hospital encounter of 08/24/19 (from the past 24 hour(s))  SARS CORONAVIRUS 2 (TAT 6-24 HRS) Nasopharyngeal Nasopharyngeal Swab     Status: None   Collection Time: 08/24/19 10:41 AM   Specimen: Nasopharyngeal Swab  Result Value Ref Range   SARS Coronavirus 2 NEGATIVE NEGATIVE   Group B  Strep negative  Assessment   G3P1011 at [redacted]w[redacted]d Estimated Date of Delivery: 08/31/19  The fetus is reassuring.   Patient Active Problem List   Diagnosis Date Noted  . Labor and delivery, indication for care 08/25/2019  . Uterine contractions during pregnancy   . Indication for care in labor and delivery, antepartum 08/14/2019  . Obstetrical laceration, fourth degree 05/24/2019  . History of 2019 novel coronavirus disease (COVID-19) 05/24/2019  . Maternal varicella, non-immune 02/20/2019  . Vitamin D deficiency disease 09/03/2014  . Hypothyroidism  01/22/2011    Plan  1. Admit to L&D :   2. EFM:-- Category 1 3. Stadol or Epidural if desired.   4. Admission labs  5. Anticipate NSVD  Doreene Burke, CNM  08/25/2019 12:49 AM

## 2019-08-25 NOTE — Progress Notes (Signed)
LABOR NOTE   Kristi [name] 34 y.o.@ at [redacted]w[redacted]d  SUBJECTIVE:  Feeling mild cramping Analgesia: Labor support without medications  OBJECTIVE:  BP 128/80 (BP Location: Left Arm)   Pulse 86   Temp 98 F (36.7 C) (Oral)   Resp 18   Ht 5\' 10"  (1.778 m)   Wt 129.7 kg   LMP 11/24/2018   BMI 41.04 kg/m  No intake/output data recorded.  She has shown cervical change. CERVIX: 4-5cm:  80%:   -2:   mid position:   soft SVE:   Dilation: 4.5 Effacement (%): 70, 80 Station: -2 Exam by:: A Wynee Matarazzo CNM CONTRACTIONS: regular, every 3-4 minutes FHR: Fetal heart tracing reviewed. Baseline: 130 bpm, Variability: Good {> 6 bpm), Accelerations: Reactive and Decelerations: Absent Category I    Labs: Lab Results  Component Value Date   WBC 8.9 08/25/2019   HGB 10.6 (L) 08/25/2019   HCT 33.1 (L) 08/25/2019   MCV 84.9 08/25/2019   PLT 209 08/25/2019    ASSESSMENT: 1) Labor curve reviewed.       Progress: Early latent labor.     Membranes: intact            Active Problems:   Labor and delivery, indication for care   PLAN: IV Pitocin augmentation   08/27/2019, CNM 08/25/2019 7:52 AM

## 2019-08-25 NOTE — Anesthesia Procedure Notes (Signed)
Epidural Patient location during procedure: OB Start time: 08/25/2019 11:42 AM End time: 08/25/2019 12:00 PM  Staffing Anesthesiologist: Karleen Hampshire, MD Performed: anesthesiologist   Preanesthetic Checklist Completed: patient identified, IV checked, site marked, risks and benefits discussed, surgical consent, monitors and equipment checked, pre-op evaluation and timeout performed  Epidural Patient position: sitting Prep: ChloraPrep Patient monitoring: heart rate, continuous pulse ox and blood pressure Approach: midline Location: L4-L5 Injection technique: LOR saline  Needle:  Needle type: Tuohy  Needle gauge: 18 G Needle length: 9 cm and 9 Needle insertion depth: 6 cm Catheter type: closed end flexible Catheter size: 20 Guage Catheter at skin depth: 11 cm Test dose: negative and Other  Assessment Events: blood not aspirated, injection not painful, no injection resistance, no paresthesia and negative IV test  Additional Notes Risks and benefits of procedure discussed with patient.  Risks including but not limited to infection, spinal/epidural hematoma, nerve injury, post dural puncture headache, and inadequate/failed block.  Patient expressed understanding and consented to epidural placement. Negative dural puncture.  Negative aspiration.  Negative paresthesia on injection.  Dose given in divided aliquots.  Patient tolerated the procedure well with no immediate complications.   Reason for block:procedure for pain

## 2019-08-26 ENCOUNTER — Ambulatory Visit: Payer: Self-pay

## 2019-08-26 LAB — CBC
HCT: 27.9 % — ABNORMAL LOW (ref 36.0–46.0)
Hemoglobin: 8.9 g/dL — ABNORMAL LOW (ref 12.0–15.0)
MCH: 26.9 pg (ref 26.0–34.0)
MCHC: 31.9 g/dL (ref 30.0–36.0)
MCV: 84.3 fL (ref 80.0–100.0)
Platelets: 169 10*3/uL (ref 150–400)
RBC: 3.31 MIL/uL — ABNORMAL LOW (ref 3.87–5.11)
RDW: 14.6 % (ref 11.5–15.5)
WBC: 12.3 10*3/uL — ABNORMAL HIGH (ref 4.0–10.5)
nRBC: 0 % (ref 0.0–0.2)

## 2019-08-26 LAB — BPAM RBC
Blood Product Expiration Date: 202105202359
Blood Product Expiration Date: 202105202359
Unit Type and Rh: 5100
Unit Type and Rh: 5100

## 2019-08-26 LAB — TYPE AND SCREEN
ABO/RH(D): O POS
Antibody Screen: NEGATIVE
Unit division: 0
Unit division: 0

## 2019-08-26 LAB — PREPARE RBC (CROSSMATCH)

## 2019-08-26 MED ORDER — FERROUS SULFATE 325 (65 FE) MG PO TABS: 325.0000 mg | ORAL_TABLET | Freq: Every day | ORAL | 3 refills | Status: DC

## 2019-08-26 MED ORDER — IBUPROFEN 600 MG PO TABS: 600.0000 mg | ORAL_TABLET | Freq: Four times a day (QID) | ORAL | 0 refills | Status: DC

## 2019-08-26 MED ORDER — OXYCODONE-ACETAMINOPHEN 5-325 MG PO TABS: 1.0000 | ORAL_TABLET | ORAL | Status: DC | PRN

## 2019-08-26 MED ORDER — OXYCODONE-ACETAMINOPHEN 5-325 MG PO TABS: 2.0000 | ORAL_TABLET | ORAL | Status: DC | PRN

## 2019-08-26 NOTE — Discharge Summary (Signed)
Patient Name: Kristi [name] DOB: 1985-06-03 MRN: [id number]                            Discharge Summary  Date of Admission: 08/25/2019 Date of Discharge: 08/26/2019 Delivering Provider: Doreene Burke   Admitting Diagnosis: Labor and delivery, indication for care [O75.9] at [redacted]w[redacted]d Secondary diagnosis:  Active Problems:   Labor and delivery, indication for care  Mode of Delivery: normal spontaneous vaginal delivery              Discharge diagnosis: Term Pregnancy Delivered and PPH      Intrapartum Procedures: laceration 1st   Post partum procedures: none  Complications: 1st degree perineal laceration                     Discharge Day SOAP Note:  Progress Note - Vaginal Delivery  Kristi [name] is a 34 y.o. [id number] now PP day 1 s/p Vaginal, Spontaneous . Delivery was complicated by PPH, elevated BMI  Subjective  The patient has the following complaints: has no unusual complaints  Pain is controlled with current medications.   Patient is urinating without difficulty.  She is ambulating well.     Objective  Vital signs: BP 103/76 (BP Location: Left Arm)   Pulse 72   Temp 98.4 F (36.9 C) (Oral)   Resp 18   Ht 5\' 10"  (1.778 m)   Wt 129.7 kg   LMP 11/24/2018   SpO2 100%   Breastfeeding Unknown   BMI 41.04 kg/m   Physical Exam: Gen: NAD Fundus Fundal Tone: Firm  Lochia Amount: Small        Data Review Labs: Lab Results  Component Value Date   WBC 12.3 (H) 08/26/2019   HGB 8.9 (L) 08/26/2019   HCT 27.9 (L) 08/26/2019   MCV 84.3 08/26/2019   PLT 169 08/26/2019   CBC Latest Ref Rng & Units 08/26/2019 08/25/2019 07/19/2019  WBC 4.0 - 10.5 K/uL 12.3(H) 8.9 9.1  Hemoglobin 12.0 - 15.0 g/dL 09/18/2019) 10.6(L) 10.0(L)  Hematocrit 36.0 - 46.0 % 27.9(L) 33.1(L) 31.3(L)  Platelets 150 - 400 K/uL 169 209 221   O POS  Edinburgh Score: Edinburgh Postnatal Depression Scale Screening Tool 08/26/2019  I have been able to laugh and see the funny side of  things. 0  I have looked forward with enjoyment to things. 0  I have blamed myself unnecessarily when things went wrong. 0  I have been anxious or worried for no good reason. 2  I have felt scared or panicky for no good reason. 1  Things have been getting on top of me. 1  I have been so unhappy that I have had difficulty sleeping. 0  I have felt sad or miserable. 0  I have been so unhappy that I have been crying. 0  The thought of harming myself has occurred to me. 0  Edinburgh Postnatal Depression Scale Total 4    Assessment/Plan  Active Problems:   Labor and delivery, indication for care    Plan for discharge today.  Discharge Instructions: Per After Visit Summary. Activity: Advance as tolerated. Pelvic rest for 6 weeks.  Also refer to After Visit Summary Diet: Regular Medications: Allergies as of 08/26/2019   No Known Allergies     Medication List    TAKE these medications   cholecalciferol 25 MCG (1000 UNIT) tablet Commonly known as: VITAMIN D3 Take 1,000  Units by mouth daily.   ferrous sulfate 325 (65 FE) MG tablet Take 1 tablet (325 mg total) by mouth daily with breakfast. Start taking on: August 27, 2019   ibuprofen 600 MG tablet Commonly known as: ADVIL Take 1 tablet (600 mg total) by mouth every 6 (six) hours.   levothyroxine 137 MCG tablet Commonly known as: SYNTHROID Take 137 mcg by mouth daily before breakfast.   prenatal multivitamin Tabs tablet Take 1 tablet by mouth daily at 12 noon.      Outpatient follow up:  Postpartum contraception: Will discuss at first office visit post-partum  Discharged Condition: good  Discharged to: home  Newborn Data: Disposition:home with mother  Apgars: APGAR (1 MIN): 8   APGAR (5 MINS): 9   APGAR (10 MINS):    Baby Feeding: Breast    Kristi [name], CNM 08/26/2019 10:25 AM

## 2019-08-26 NOTE — Progress Notes (Signed)
Pt discharged with infant. Discharge instructions, prescriptions, and follow up appointments given to and reviewed with patient. Pt verbalized understanding. To be escorted out by staff. °

## 2019-08-26 NOTE — Final Progress Note (Signed)
Discharge Day SOAP Note:  Progress Note - Vaginal Delivery  Kristi [name] is a 34 y.o. [id number] now PP day 1 s/p Vaginal, Spontaneous . Delivery was complicated by PPH, elevated BMI  Subjective  The patient has the following complaints: has no unusual complaints  Pain is controlled with current medications.   Patient is urinating without difficulty.  She is ambulating well.     Objective  Vital signs: BP 103/76 (BP Location: Left Arm)   Pulse 72   Temp 98.4 F (36.9 C) (Oral)   Resp 18   Ht 5\' 10"  (1.778 m)   Wt 129.7 kg   LMP 11/24/2018   SpO2 100%   Breastfeeding Unknown   BMI 41.04 kg/m   Physical Exam: Gen: NAD Fundus Fundal Tone: Firm  Lochia Amount: Small        Data Review Labs: Lab Results  Component Value Date   WBC 12.3 (H) 08/26/2019   HGB 8.9 (L) 08/26/2019   HCT 27.9 (L) 08/26/2019   MCV 84.3 08/26/2019   PLT 169 08/26/2019   CBC Latest Ref Rng & Units 08/26/2019 08/25/2019 07/19/2019  WBC 4.0 - 10.5 K/uL 12.3(H) 8.9 9.1  Hemoglobin 12.0 - 15.0 g/dL 09/18/2019) 10.6(L) 10.0(L)  Hematocrit 36.0 - 46.0 % 27.9(L) 33.1(L) 31.3(L)  Platelets 150 - 400 K/uL 169 209 221   O POS  Edinburgh Score: Edinburgh Postnatal Depression Scale Screening Tool 08/26/2019  I have been able to laugh and see the funny side of things. 0  I have looked forward with enjoyment to things. 0  I have blamed myself unnecessarily when things went wrong. 0  I have been anxious or worried for no good reason. 2  I have felt scared or panicky for no good reason. 1  Things have been getting on top of me. 1  I have been so unhappy that I have had difficulty sleeping. 0  I have felt sad or miserable. 0  I have been so unhappy that I have been crying. 0  The thought of harming myself has occurred to me. 0  Edinburgh Postnatal Depression Scale Total 4    Assessment/Plan  Active Problems:   Labor and delivery, indication for care    Plan for discharge  today.  Discharge Instructions: Per After Visit Summary. Activity: Advance as tolerated. Pelvic rest for 6 weeks.  Also refer to After Visit Summary Diet: Regular Medications: Allergies as of 08/26/2019   No Known Allergies     Medication List    TAKE these medications   cholecalciferol 25 MCG (1000 UNIT) tablet Commonly known as: VITAMIN D3 Take 1,000 Units by mouth daily.   ferrous sulfate 325 (65 FE) MG tablet Take 1 tablet (325 mg total) by mouth daily with breakfast. Start taking on: August 27, 2019   ibuprofen 600 MG tablet Commonly known as: ADVIL Take 1 tablet (600 mg total) by mouth every 6 (six) hours.   levothyroxine 137 MCG tablet Commonly known as: SYNTHROID Take 137 mcg by mouth daily before breakfast.   prenatal multivitamin Tabs tablet Take 1 tablet by mouth daily at 12 noon.      Outpatient follow up:  Postpartum contraception: Will discuss at first office visit post-partum  Discharged Condition: good  Discharged to: home  Newborn Data: Disposition:home with mother  Apgars: APGAR (1 MIN): 8   APGAR (5 MINS): 9   APGAR (10 MINS):    Baby Feeding: Breast    August 29, 2019, CNM 08/26/2019 10:25 AM

## 2019-08-26 NOTE — Discharge Instructions (Signed)
Discharge Instructions:   Follow-up Appointment: Call ASAP and schedule a follow-up postpartum visit in 6 weeks!   If there are any new medications, they have been ordered and will be available for pickup at the listed pharmacy on your way home from the hospital.   Call office if you have any of the following: headache, visual changes, fever >101.0 F, chills, shortness of breath, breast concerns, excessive vaginal bleeding, incision drainage or problems, leg pain or redness, depression or any other concerns. If you have vaginal discharge with an odor, let your doctor know.   It is normal to bleed for up to 6 weeks. You should not soak through more than 1 pad in 1 hour. If you have a blood clot larger than your fist with continued bleeding, call your doctor.   Activity: Do not lift > 10 lbs for 6 weeks (do not lift anything heavier than your baby). No intercourse, tampons, swimming pools, hot tubs, baths (only showers) for 6 weeks.  No driving for 1-2 weeks. Continue prenatal vitamin, especially if breastfeeding. Increase calories and fluids (water) while breastfeeding.   Your milk will come in, in the next couple of days (right now it is colostrum). You may have a slight fever when your milk comes in, but it should go away on its own.  If it does not, and rises above 101 F please call the doctor. You will also feel achy and your breasts will be firm. They will also start to leak. If you are breastfeeding, continue as you have been and you can pump/express milk for comfort.   If you have too much milk, your breasts can become engorged, which could lead to mastitis. This is an infection of the milk ducts. It can be very painful and you will need to notify your doctor to obtain a prescription for antibiotics. You can also treat it with a shower or hot/cold compress.   For concerns about your baby, please call your pediatrician.  For breastfeeding concerns, the lactation consultant can be reached at  [phone].   Postpartum blues (feelings of happy one minute and sad another minute) are normal for the first few weeks but if it gets worse let your doctor know.   Congratulations! We enjoyed caring for you and your new bundle of joy!  

## 2019-08-26 NOTE — Lactation Note (Signed)
This note was copied from a baby's chart. Lactation Consultation Note  Patient Name: Kristi [name] [id number] Date: 08/26/2019 Reason for consult: Follow-up assessment;Mother's request;Term  Mom has small nipples that compress for latch.  Mom has bruised areas on both areolas where Kristi [name] could have latched incorrectly.  He roots eagerly for the breast.  Demonstrated sandwich hold to make sure he latches far back on nipple and not to let go of breast before she is sure he has the right spot with the nipple and a good portion of the areola in his mouth.  Mom does report slightly tender breast where he latched on inadequately, but no severe pain.  Mom is watching for feeding cues and is willing to put him to the breast whenever he demonstrates hunger cues.  Reviewed normal newborn stomach size, supply and demand, adequate out put, normal course of lactation and routine newborn feeding patterns.  Mom going home soon.  Lactation community resource hand out with contact numbers given and reviewed encouraging mom to call with any questions, concerns or assistance.   Maternal Data Formula Feeding for Exclusion: No Has patient been taught Hand Expression?: Yes Does the patient have breastfeeding experience prior to this delivery?: No(Attempted - never could get to latch)  Feeding Feeding Type: Breast Fed  LATCH Score Latch: Repeated attempts needed to sustain latch, nipple held in mouth throughout feeding, stimulation needed to elicit sucking reflex.  Audible Swallowing: A few with stimulation  Type of Nipple: Everted at rest and after stimulation  Comfort (Breast/Nipple): Filling, red/small blisters or bruises, mild/mod discomfort  Hold (Positioning): No assistance needed to correctly position infant at breast.  LATCH Score: 7  Interventions Interventions: Breast feeding basics reviewed;Adjust position;Support pillows;Coconut oil  Lactation Tools Discussed/Used WIC Program:  No(BCBS)   Consult Status Consult Status: PRN Follow-up type: Call as needed    Louis Meckel 08/26/2019, 6:53 PM

## 2019-08-28 NOTE — Anesthesia Postprocedure Evaluation (Signed)
Anesthesia Post Note  Patient: Kristi [name]  Procedure(s) Performed: AN AD HOC LABOR EPIDURAL  Anesthesia Type: Epidural     Last Vitals: There were no vitals filed for this visit.  Last Pain: There were no vitals filed for this visit.  Pt was discharged before being seen by post op epidural team. No issues with epidural noted in record, pt ambulating well per nursing notes.               Karleen Hampshire

## 2019-09-05 ENCOUNTER — Other Ambulatory Visit: Payer: BC Managed Care – PPO

## 2019-09-05 ENCOUNTER — Other Ambulatory Visit: Payer: Self-pay | Admitting: Certified Nurse Midwife

## 2019-09-05 ENCOUNTER — Other Ambulatory Visit: Payer: Self-pay

## 2019-09-05 ENCOUNTER — Telehealth: Payer: Self-pay

## 2019-09-05 DIAGNOSIS — O9081 Anemia of the puerperium: Secondary | ICD-10-CM

## 2019-09-05 NOTE — Telephone Encounter (Signed)
Called patient to see how she was feeling. She states she feels tired,has a headache, is cold but is not running a fever. She states the clots she is passing are about the size of a quarter. States she is cramping and her bleeding is dark red in color. Encouraged her to rest and stay hydrated.

## 2019-09-06 LAB — CBC
Hematocrit: 33.8 % — ABNORMAL LOW (ref 34.0–46.6)
Hemoglobin: 10.6 g/dL — ABNORMAL LOW (ref 11.1–15.9)
MCH: 26.6 pg (ref 26.6–33.0)
MCHC: 31.4 g/dL — ABNORMAL LOW (ref 31.5–35.7)
MCV: 85 fL (ref 79–97)
Platelets: 396 10*3/uL (ref 150–450)
RBC: 3.98 x10E6/uL (ref 3.77–5.28)
RDW: 13.3 % (ref 11.7–15.4)
WBC: 6.8 10*3/uL (ref 3.4–10.8)

## 2019-09-06 LAB — FERRITIN: Ferritin: 17 ng/mL (ref 15–150)

## 2019-09-10 ENCOUNTER — Ambulatory Visit (INDEPENDENT_AMBULATORY_CARE_PROVIDER_SITE_OTHER): Payer: BC Managed Care – PPO | Admitting: Certified Nurse Midwife

## 2019-09-10 ENCOUNTER — Encounter: Payer: Self-pay | Admitting: Certified Nurse Midwife

## 2019-09-10 ENCOUNTER — Other Ambulatory Visit: Payer: Self-pay

## 2019-09-10 DIAGNOSIS — Z1331 Encounter for screening for depression: Secondary | ICD-10-CM

## 2019-09-10 NOTE — Progress Notes (Signed)
Virtual Visit via Telephone Note  I connected with Kristi [name] on 09/10/19 at 11:15 AM EDT by telephone and verified that I am speaking with the correct person using two identifiers.   I discussed the limitations, risks, security and privacy concerns of performing an evaluation and management service by telephone and the availability of in person appointments. I also discussed with the patient that there may be a patient responsible charge related to this service. The patient expressed understanding and agreed to proceed.   History of Present Illness:   34 yr old [id number] SVD on 08/25/19. Tele visit for mood check 2 wk post partum.   Observations/Objective: Doing well. Bleeding has decreased . Was passing small quarter size clots. She states she has always passed clots with her periods so this is not new for her but admits that it has slowed. She state that Lissa Hoard is not gaining weight. He has not lost wt either. She has been taking him to Peds frequently to monitor. She is breast and bottle feeding. She is taking motrin/tyleno as needed ( maybe once a day). She is doing well with her mood, denies any concerns. She has not decided on [facility] yet. Will discuss at 6 wk visit.   PHQ9 SCORE ONLY 09/10/2019  Score 1    Assessment and Plan: Continue PNV and iron , will recheck CBC at [facility]. She has good support and will ask family to watch baby so she can rest or have time for herself.     I discussed the assessment and treatment plan with the patient. The patient was provided an opportunity to ask questions and all were answered. The patient agreed with the plan and demonstrated an understanding of the instructions.   The patient was advised to call back or seek an in-person evaluation if the symptoms worsen or if the condition fails to improve as anticipated.  I provided 10 minutes of non-face-to-face time during this encounter.   Doreene Burke, CNM

## 2019-09-10 NOTE — Progress Notes (Signed)
Received transferred call from [place]. DOB used as identifier. Patient is pumping and supplementing. States her menstrual flow is moderate with some small clots. PhQ90= 1. Call transferred to [facility] CNM for televisit.

## 2019-10-09 ENCOUNTER — Encounter: Payer: Self-pay | Admitting: Certified Nurse Midwife

## 2019-10-09 ENCOUNTER — Other Ambulatory Visit: Payer: Self-pay

## 2019-10-09 ENCOUNTER — Ambulatory Visit (INDEPENDENT_AMBULATORY_CARE_PROVIDER_SITE_OTHER): Payer: BC Managed Care – PPO | Admitting: Certified Nurse Midwife

## 2019-10-09 DIAGNOSIS — N941 Unspecified dyspareunia: Secondary | ICD-10-CM

## 2019-10-09 MED ORDER — NORETHINDRONE ACET-ETHINYL EST 1.5-30 MG-MCG PO TABS: 1.0000 | ORAL_TABLET | Freq: Every day | ORAL | 11 refills | Status: DC

## 2019-10-09 NOTE — Patient Instructions (Signed)
Preventive Care 21-32 Years Old, Female Preventive care refers to visits with your health care provider and lifestyle choices that can promote health and wellness. This includes:  A yearly physical exam. This may also be called an annual well check.  Regular dental visits and eye exams.  Immunizations.  Screening for certain conditions.  Healthy lifestyle choices, such as eating a healthy diet, getting regular exercise, not using drugs or products that contain nicotine and tobacco, and limiting alcohol use. What can I expect for my preventive care visit? Physical exam Your health care provider will check your:  Height and weight. This may be used to calculate body mass index (BMI), which tells if you are at a healthy weight.  Heart rate and blood pressure.  Skin for abnormal spots. Counseling Your health care provider may ask you questions about your:  Alcohol, tobacco, and drug use.  Emotional well-being.  Home and relationship well-being.  Sexual activity.  Eating habits.  Work and work environment.  Method of birth control.  Menstrual cycle.  Pregnancy history. What immunizations do I need?  Influenza (flu) vaccine  This is recommended every year. Tetanus, diphtheria, and pertussis (Tdap) vaccine  You may need a Td booster every 10 years. Varicella (chickenpox) vaccine  You may need this if you have not been vaccinated. Human papillomavirus (HPV) vaccine  If recommended by your health care provider, you may need three doses over 6 months. Measles, mumps, and rubella (MMR) vaccine  You may need at least one dose of MMR. You may also need a second dose. Meningococcal conjugate (MenACWY) vaccine  One dose is recommended if you are age 19-21 years and a first-year college student living in a residence hall, or if you have one of several medical conditions. You may also need additional booster doses. Pneumococcal conjugate (PCV13) vaccine  You may need  this if you have certain conditions and were not previously vaccinated. Pneumococcal polysaccharide (PPSV23) vaccine  You may need one or two doses if you smoke cigarettes or if you have certain conditions. Hepatitis A vaccine  You may need this if you have certain conditions or if you travel or work in places where you may be exposed to hepatitis A. Hepatitis B vaccine  You may need this if you have certain conditions or if you travel or work in places where you may be exposed to hepatitis B. Haemophilus influenzae type b (Hib) vaccine  You may need this if you have certain conditions. You may receive vaccines as individual doses or as more than one vaccine together in one shot (combination vaccines). Talk with your health care provider about the risks and benefits of combination vaccines. What tests do I need?  Blood tests  Lipid and cholesterol levels. These may be checked every 5 years starting at age 20.  Hepatitis C test.  Hepatitis B test. Screening  Diabetes screening. This is done by checking your blood sugar (glucose) after you have not eaten for a while (fasting).  Sexually transmitted disease (STD) testing.  BRCA-related cancer screening. This may be done if you have a family history of breast, ovarian, tubal, or peritoneal cancers.  Pelvic exam and Pap test. This may be done every 3 years starting at age 21. Starting at age 30, this may be done every 5 years if you have a Pap test in combination with an HPV test. Talk with your health care provider about your test results, treatment options, and if necessary, the need for more tests.   Follow these instructions at home: Eating and drinking   Eat a diet that includes fresh fruits and vegetables, whole grains, lean protein, and low-fat dairy.  Take vitamin and mineral supplements as recommended by your health care provider.  Do not drink alcohol if: ? Your health care provider tells you not to drink. ? You are  pregnant, may be pregnant, or are planning to become pregnant.  If you drink alcohol: ? Limit how much you have to 0-1 drink a day. ? Be aware of how much alcohol is in your drink. In the [place] one drink equals one 12 oz bottle of beer (355 mL), one 5 oz glass of wine (148 mL), or one 1 oz glass of hard liquor (44 mL). Lifestyle  Take daily care of your teeth and gums.  Stay active. Exercise for at least 30 minutes on 5 or more days each week.  Do not use any products that contain nicotine or tobacco, such as cigarettes, e-cigarettes, and chewing tobacco. If you need help quitting, ask your health care provider.  If you are sexually active, practice safe sex. Use a condom or other form of birth control (contraception) in order to prevent pregnancy and STIs (sexually transmitted infections). If you plan to become pregnant, see your health care provider for a preconception visit. What's next?  Visit your health care provider once a year for a well check visit.  Ask your health care provider how often you should have your eyes and teeth checked.  Stay up to date on all vaccines. This information is not intended to replace advice given to you by your health care provider. Make sure you discuss any questions you have with your health care provider. Document Revised: 01/05/2018 Document Reviewed: 01/05/2018 Elsevier Patient Education  2020 [employer].

## 2019-10-09 NOTE — Progress Notes (Signed)
Subjective:    Kristi [name] is a 34 y.o. G66P2012 Caucasian female who presents for a postpartum visit. She is 6 weeks postpartum following a spontaneous vaginal delivery at 39.1 gestational weeks. Anesthesia: epidural. I have fully reviewed the prenatal and intrapartum course. Postpartum course has been WNL. Baby's course has been WNL. Baby is feeding by breast. Bleeding no bleeding. Bowel function is normal. Bladder function is normal. Patient is sexually active. Complains of pain with intercourse. Contraception method is OCP (estrogen/progesterone). Postpartum depression screening: negative. Score 0.  Last pap 10/17/18and was negative.  The following portions of the patient's history were reviewed and updated as appropriate: allergies, current medications, past medical history, past surgical history and problem list.  Review of Systems Pertinent items are noted in HPI.   Vitals:   10/09/19 0932  BP: 115/80  Pulse: 64  Weight: 258 lb 9 oz (117.3 kg)  Height: 5\' 10"  (1.778 m)   No LMP recorded.  Objective:   General:  alert, cooperative and no distress   Breasts:  deferred, no complaints  Lungs: clear to auscultation bilaterally  Heart:  regular rate and rhythm  Abdomen: soft, nontender   Vulva: normal  Vagina: normal vagina  Cervix:  closed  Corpus: Well-involuted  Adnexa:  Non-palpable  Rectal Exam: no hemorrhoids        Assessment:   Postpartum exam 6 wks s/p SVD Breastfeeding Depression screening Contraception counseling   Plan:  : OCP (estrogen/progesterone), orders placed. PT deneis contraindication for use. Orders placed for pelvic floor therapy due to dyspareunia.  Follow up in: 4 months for annual/pap smear or earlier if needed  , CNM

## 2019-12-28 NOTE — Telephone Encounter (Signed)
error 

## 2020-02-06 ENCOUNTER — Other Ambulatory Visit: Payer: Self-pay | Admitting: Family Medicine

## 2020-02-06 DIAGNOSIS — E039 Hypothyroidism, unspecified: Secondary | ICD-10-CM

## 2020-02-06 DIAGNOSIS — R519 Headache, unspecified: Secondary | ICD-10-CM

## 2020-02-18 ENCOUNTER — Other Ambulatory Visit (HOSPITAL_COMMUNITY)
Admission: RE | Admit: 2020-02-18 | Discharge: 2020-02-18 | Disposition: A | Discharge status: Home / Self Care | Payer: BC Managed Care – PPO | Source: Ambulatory Visit | Attending: Certified Nurse Midwife | Admitting: Certified Nurse Midwife

## 2020-02-18 ENCOUNTER — Other Ambulatory Visit: Payer: Self-pay

## 2020-02-18 ENCOUNTER — Encounter: Payer: Self-pay | Admitting: Certified Nurse Midwife

## 2020-02-18 ENCOUNTER — Ambulatory Visit (INDEPENDENT_AMBULATORY_CARE_PROVIDER_SITE_OTHER): Payer: BC Managed Care – PPO | Admitting: Certified Nurse Midwife

## 2020-02-18 VITALS — BP 115/81 | HR 68 | Ht 70.0 in | Wt 235.2 lb

## 2020-02-18 DIAGNOSIS — Z124 Encounter for screening for malignant neoplasm of cervix: Secondary | ICD-10-CM | POA: Diagnosis present

## 2020-02-18 DIAGNOSIS — Z01419 Encounter for gynecological examination (general) (routine) without abnormal findings: Secondary | ICD-10-CM

## 2020-02-18 DIAGNOSIS — E669 Obesity, unspecified: Secondary | ICD-10-CM

## 2020-02-18 DIAGNOSIS — Z1159 Encounter for screening for other viral diseases: Secondary | ICD-10-CM | POA: Diagnosis not present

## 2020-02-18 DIAGNOSIS — Z23 Encounter for immunization: Secondary | ICD-10-CM | POA: Diagnosis not present

## 2020-02-18 MED ORDER — SERTRALINE HCL 50 MG PO TABS: 50.0000 mg | ORAL_TABLET | Freq: Every day | ORAL | 1 refills | Status: DC

## 2020-02-18 NOTE — Progress Notes (Signed)
GYNECOLOGY ANNUAL PREVENTATIVE CARE ENCOUNTER NOTE  History:     Kristi [name] is a 34 y.o. [phone] female here for a routine annual gynecologic exam.  Current complaints: anxiety due to recent loss of her father   Denies abnormal vaginal bleeding, discharge, pelvic pain, problems with intercourse or other gynecologic concerns.     Social Relationship: married Living:husband and kids Work:[employer] FT Exercise:3-7 days , walking x 45 min.  Smoke/Alcohol/drug use: none  Gynecologic History Patient's last menstrual period was 02/05/2020 (approximate). Contraception: condoms Last Pap: 02/23/2017 Results were: normal  Last mammogram: n/a   Upstream - 02/18/20 0900      Pregnancy Intention Screening   Does the patient want to become pregnant in the next year? No    Does the patient's partner want to become pregnant in the next year? No    Would the patient like to discuss contraceptive options today? No      Contraception Wrap Up   Contraception Counseling Provided No          The pregnancy intention screening data noted above was reviewed. Potential methods of contraception were discussed. The patient elected to proceed with Female Condom.    Obstetric History OB History  Gravida Para Term Preterm AB Living  3 2 2   1 2   SAB TAB Ectopic Multiple Live Births  1     0 2    # Outcome Date GA Lbr Len/2nd Weight Sex Delivery Anes PTL Lv  3 Term 08/25/19 [redacted]w[redacted]d / 00:39 9 lb 15.8 oz (4.53 kg) M Vag-Spont EPI  LIV  2 Term 05/15/14 [redacted]w[redacted]d / 04:30 9 lb 3 oz (4.167 kg) M Vag-Vacuum EPI N LIV     Birth Comments: hematoma from vacuum, jaundice     Complications: Dysfunctional Labor  1 SAB 2015            Past Medical History:  Diagnosis Date  . Acid reflux   . Hypothyroidism   . Obesity   . Pap smear, low-risk 2011    No past surgical history on file.  Current Outpatient Medications on File Prior to Visit  Medication Sig Dispense Refill  . cholecalciferol  (VITAMIN D3) 25 MCG (1000 UNIT) tablet Take 1,000 Units by mouth daily.    . fluticasone (FLONASE) 50 MCG/ACT nasal spray Place into the nose.    . ibuprofen (ADVIL) 600 MG tablet Take 1 tablet (600 mg total) by mouth every 6 (six) hours. 30 tablet 0  . levothyroxine (SYNTHROID) 150 MCG tablet Take 150 mcg by mouth daily.    . pantoprazole (PROTONIX) 40 MG tablet Take 40 mg by mouth daily.     No current facility-administered medications on file prior to visit.    No Known Allergies  Social History:  reports that she has never smoked. She has never used smokeless tobacco. She reports that she does not drink alcohol and does not use drugs.  Family History  Problem Relation Age of Onset  . Hypothyroidism Mother   . Hypothyroidism Maternal Grandmother   . Heart disease Paternal Grandfather   . Hypothyroidism Father     The following portions of the patient's history were reviewed and updated as appropriate: allergies, current medications, past family history, past medical history, past social history, past surgical history and problem list.  Review of Systems Pertinent items noted in HPI and remainder of comprehensive ROS otherwise negative.  Physical Exam:  BP (!) 126/93   Pulse 70  Ht 5\' 10"  (1.778 m)   Wt 235 lb 3 oz (106.7 kg)   LMP 02/05/2020 (Approximate)   Breastfeeding No   BMI 33.75 kg/m  CONSTITUTIONAL: Well-developed, well-nourished, over weight female in no acute distress.  HENT:  Normocephalic, atraumatic, External right and left ear normal. Oropharynx is clear and moist EYES: Conjunctivae and EOM are normal. Pupils are equal, round, and reactive to light. No scleral icterus.  NECK: Normal range of motion, supple, no masses.  Normal thyroid.  SKIN: Skin is warm and dry. No rash noted. Not diaphoretic. No erythema. No pallor. MUSCULOSKELETAL: Normal range of motion. No tenderness.  No cyanosis, clubbing, or edema.  2+ distal pulses. NEUROLOGIC: Alert and oriented to  person, place, and time. Normal reflexes, muscle tone coordination.  PSYCHIATRIC: Normal mood and affect. Normal behavior. Normal judgment and thought content. CARDIOVASCULAR: Normal heart rate noted, regular rhythm RESPIRATORY: Clear to auscultation bilaterally. Effort and breath sounds normal, no problems with respiration noted. BREASTS: Symmetric in size. No masses, tenderness, skin changes, nipple drainage, or lymphadenopathy bilaterally.  ABDOMEN: Soft, no distention noted.  No tenderness, rebound or guarding.  PELVIC: Normal appearing external genitalia and urethral meatus; normal appearing vaginal mucosa and cervix.  No abnormal discharge noted.  Pap smear obtained., contact bleeding present   Normal uterine size, no other palpable masses, no uterine or adnexal tenderness.  .   Assessment and Plan:    1. Women's annual routine gynecological examination    Pap:Will follow up results of pap smear and manage accordingly. Mammogram : n/a  Labs:Hep C, Lipid Refills/orders: zoloft Referral:none Routine preventative health maintenance measures emphasized. Please refer to After Visit Summary for other counseling recommendations.  Follow up 4 wks for medication check      02/07/2020, CNM [facility],  [facility]

## 2020-02-18 NOTE — Addendum Note (Signed)
Addended by: Mechele Claude on: 02/18/2020 09:42 AM   Modules accepted: Orders

## 2020-02-18 NOTE — Patient Instructions (Signed)
Preventive Care 21-32 Years Old, Female Preventive care refers to visits with your health care provider and lifestyle choices that can promote health and wellness. This includes:  A yearly physical exam. This may also be called an annual well check.  Regular dental visits and eye exams.  Immunizations.  Screening for certain conditions.  Healthy lifestyle choices, such as eating a healthy diet, getting regular exercise, not using drugs or products that contain nicotine and tobacco, and limiting alcohol use. What can I expect for my preventive care visit? Physical exam Your health care provider will check your:  Height and weight. This may be used to calculate body mass index (BMI), which tells if you are at a healthy weight.  Heart rate and blood pressure.  Skin for abnormal spots. Counseling Your health care provider may ask you questions about your:  Alcohol, tobacco, and drug use.  Emotional well-being.  Home and relationship well-being.  Sexual activity.  Eating habits.  Work and work environment.  Method of birth control.  Menstrual cycle.  Pregnancy history. What immunizations do I need?  Influenza (flu) vaccine  This is recommended every year. Tetanus, diphtheria, and pertussis (Tdap) vaccine  You may need a Td booster every 10 years. Varicella (chickenpox) vaccine  You may need this if you have not been vaccinated. Human papillomavirus (HPV) vaccine  If recommended by your health care provider, you may need three doses over 6 months. Measles, mumps, and rubella (MMR) vaccine  You may need at least one dose of MMR. You may also need a second dose. Meningococcal conjugate (MenACWY) vaccine  One dose is recommended if you are age 19-21 years and a first-year college student living in a residence hall, or if you have one of several medical conditions. You may also need additional booster doses. Pneumococcal conjugate (PCV13) vaccine  You may need  this if you have certain conditions and were not previously vaccinated. Pneumococcal polysaccharide (PPSV23) vaccine  You may need one or two doses if you smoke cigarettes or if you have certain conditions. Hepatitis A vaccine  You may need this if you have certain conditions or if you travel or work in places where you may be exposed to hepatitis A. Hepatitis B vaccine  You may need this if you have certain conditions or if you travel or work in places where you may be exposed to hepatitis B. Haemophilus influenzae type b (Hib) vaccine  You may need this if you have certain conditions. You may receive vaccines as individual doses or as more than one vaccine together in one shot (combination vaccines). Talk with your health care provider about the risks and benefits of combination vaccines. What tests do I need?  Blood tests  Lipid and cholesterol levels. These may be checked every 5 years starting at age 20.  Hepatitis C test.  Hepatitis B test. Screening  Diabetes screening. This is done by checking your blood sugar (glucose) after you have not eaten for a while (fasting).  Sexually transmitted disease (STD) testing.  BRCA-related cancer screening. This may be done if you have a family history of breast, ovarian, tubal, or peritoneal cancers.  Pelvic exam and Pap test. This may be done every 3 years starting at age 21. Starting at age 30, this may be done every 5 years if you have a Pap test in combination with an HPV test. Talk with your health care provider about your test results, treatment options, and if necessary, the need for more tests.   Follow these instructions at home: Eating and drinking   Eat a diet that includes fresh fruits and vegetables, whole grains, lean protein, and low-fat dairy.  Take vitamin and mineral supplements as recommended by your health care provider.  Do not drink alcohol if: ? Your health care provider tells you not to drink. ? You are  pregnant, may be pregnant, or are planning to become pregnant.  If you drink alcohol: ? Limit how much you have to 0-1 drink a day. ? Be aware of how much alcohol is in your drink. In the [place] one drink equals one 12 oz bottle of beer (355 mL), one 5 oz glass of wine (148 mL), or one 1 oz glass of hard liquor (44 mL). Lifestyle  Take daily care of your teeth and gums.  Stay active. Exercise for at least 30 minutes on 5 or more days each week.  Do not use any products that contain nicotine or tobacco, such as cigarettes, e-cigarettes, and chewing tobacco. If you need help quitting, ask your health care provider.  If you are sexually active, practice safe sex. Use a condom or other form of birth control (contraception) in order to prevent pregnancy and STIs (sexually transmitted infections). If you plan to become pregnant, see your health care provider for a preconception visit. What's next?  Visit your health care provider once a year for a well check visit.  Ask your health care provider how often you should have your eyes and teeth checked.  Stay up to date on all vaccines. This information is not intended to replace advice given to you by your health care provider. Make sure you discuss any questions you have with your health care provider. Document Revised: 01/05/2018 Document Reviewed: 01/05/2018 Elsevier Patient Education  2020 [employer].

## 2020-02-19 LAB — LIPID PANEL
Chol/HDL Ratio: 5.7 ratio — ABNORMAL HIGH (ref 0.0–4.4)
Cholesterol, Total: 217 mg/dL — ABNORMAL HIGH (ref 100–199)
HDL: 38 mg/dL — ABNORMAL LOW (ref >39)
LDL Chol Calc (NIH): 138 mg/dL — ABNORMAL HIGH (ref 0–99)
Triglycerides: 225 mg/dL — ABNORMAL HIGH (ref 0–149)
VLDL Cholesterol Cal: 41 mg/dL — ABNORMAL HIGH (ref 5–40)

## 2020-02-19 LAB — HEPATITIS C ANTIBODY: Hep C Virus Ab: <0.1 s/co ratio (ref 0.0–0.9)

## 2020-02-20 LAB — CYTOLOGY - PAP
Adequacy: ABSENT
Comment: NEGATIVE
Diagnosis: NEGATIVE
High risk HPV: NEGATIVE

## 2020-02-25 ENCOUNTER — Other Ambulatory Visit: Payer: Self-pay

## 2020-02-25 ENCOUNTER — Ambulatory Visit
Admission: RE | Admit: 2020-02-25 | Discharge: 2020-02-25 | Disposition: A | Discharge status: Home / Self Care | Payer: BC Managed Care – PPO | Source: Ambulatory Visit | Attending: Family Medicine | Admitting: Family Medicine

## 2020-02-25 DIAGNOSIS — E039 Hypothyroidism, unspecified: Secondary | ICD-10-CM | POA: Diagnosis present

## 2020-02-25 DIAGNOSIS — R519 Headache, unspecified: Secondary | ICD-10-CM | POA: Diagnosis present

## 2020-03-17 ENCOUNTER — Encounter: Payer: BC Managed Care – PPO | Admitting: Certified Nurse Midwife

## 2020-03-24 ENCOUNTER — Ambulatory Visit (INDEPENDENT_AMBULATORY_CARE_PROVIDER_SITE_OTHER): Payer: BC Managed Care – PPO | Admitting: Certified Nurse Midwife

## 2020-03-24 ENCOUNTER — Telehealth: Payer: Self-pay

## 2020-03-24 ENCOUNTER — Encounter: Payer: Self-pay | Admitting: Certified Nurse Midwife

## 2020-03-24 ENCOUNTER — Other Ambulatory Visit: Payer: Self-pay

## 2020-03-24 DIAGNOSIS — Z79899 Other long term (current) drug therapy: Secondary | ICD-10-CM | POA: Diagnosis not present

## 2020-03-24 MED ORDER — SERTRALINE HCL 100 MG PO TABS: 100.0000 mg | ORAL_TABLET | Freq: Every day | ORAL | 1 refills | Status: DC

## 2020-03-24 NOTE — Patient Instructions (Signed)

## 2020-03-24 NOTE — Progress Notes (Signed)
Received a transferred call from [place] for a televisit. DOB as identifier. This visit is for a followup after starting Zoloft. Patient states it is not really helping with her anxiety. PhQ9=8; GAD=16. Call transferred to Doreene Burke CNM for completion.

## 2020-03-24 NOTE — Telephone Encounter (Signed)
mychart message sent to patient

## 2020-03-24 NOTE — Progress Notes (Signed)
Virtual Visit via Telephone Note  I connected with Kristi [name] on 03/24/20 at  4:00 PM EST by telephone and verified that I am speaking with the correct person using two identifiers.  Location: Patient: @ home Provider: Doreene Burke, CNM @ office   I discussed the limitations, risks, security and privacy concerns of performing an evaluation and management service by telephone and the availability of in person appointments. I also discussed with the patient that there may be a patient responsible charge related to this service. The patient expressed understanding and agreed to proceed.   History of Present Illness: Pt started on zoloft postpartum due to anxiety and depression. Pt father suddenly passed away and she has been struggling with grief and anxiety.    Observations/Objective: States she first did not like medication because if made her feel like she could not cry and she did not like that feeling. She expressed that she wanted to be able to grieve. She state that it is no longer that way She state she feels less depressed but is still having a lot of anxiety . She feels anxious whenever she is not with her family.     Office Visit from 03/24/2020 in [facility]  PHQ-9 Total Score 8     GAD 7 : Generalized Anxiety Score 03/24/2020  Nervous, Anxious, on Edge 3  Control/stop worrying 0  Worry too much - different things 3  Trouble relaxing 3  Restless 1  Easily annoyed or irritable 3  Afraid - awful might happen 3  Total GAD 7 Score 16  Anxiety Difficulty Somewhat difficult    Assessment and Plan:  Zoloft dose increased to 100 mg daily. Discussed adding counseling to treatment plan if 100 mg dose is not helping. She verbalizes and agrees to plan.   Follow Up Instructions:  4 wks for medication check.     I discussed the assessment and treatment plan with the patient. The patient was provided an opportunity to ask questions and all were answered. The  patient agreed with the plan and demonstrated an understanding of the instructions.   The patient was advised to call back or seek an in-person evaluation if the symptoms worsen or if the condition fails to improve as anticipated.  I provided 8 minutes of non-face-to-face time during this encounter.   Doreene Burke, CNM

## 2020-03-26 ENCOUNTER — Other Ambulatory Visit: Payer: Self-pay | Admitting: Family Medicine

## 2020-03-26 DIAGNOSIS — M25562 Pain in left knee: Secondary | ICD-10-CM

## 2020-03-27 ENCOUNTER — Ambulatory Visit
Admission: RE | Admit: 2020-03-27 | Discharge: 2020-03-27 | Disposition: A | Discharge status: Home / Self Care | Payer: BC Managed Care – PPO | Source: Ambulatory Visit | Attending: Family Medicine | Admitting: Family Medicine

## 2020-03-27 ENCOUNTER — Ambulatory Visit
Admission: RE | Admit: 2020-03-27 | Discharge: 2020-03-27 | Disposition: A | Discharge status: Home / Self Care | Payer: BC Managed Care – PPO | Attending: Family Medicine | Admitting: Family Medicine

## 2020-03-27 ENCOUNTER — Other Ambulatory Visit: Payer: Self-pay | Admitting: Family Medicine

## 2020-03-27 DIAGNOSIS — M25562 Pain in left knee: Secondary | ICD-10-CM | POA: Insufficient documentation

## 2020-03-28 ENCOUNTER — Ambulatory Visit
Admission: RE | Admit: 2020-03-28 | Discharge: 2020-03-28 | Disposition: A | Discharge status: Home / Self Care | Payer: BC Managed Care – PPO | Source: Ambulatory Visit | Attending: Family Medicine | Admitting: Family Medicine

## 2020-03-28 DIAGNOSIS — M25562 Pain in left knee: Secondary | ICD-10-CM | POA: Diagnosis present

## 2020-04-21 ENCOUNTER — Other Ambulatory Visit: Payer: Self-pay

## 2020-04-21 ENCOUNTER — Encounter: Payer: Self-pay | Admitting: Certified Nurse Midwife

## 2020-04-21 ENCOUNTER — Ambulatory Visit (INDEPENDENT_AMBULATORY_CARE_PROVIDER_SITE_OTHER): Payer: BC Managed Care – PPO | Admitting: Certified Nurse Midwife

## 2020-04-21 VITALS — BP 109/74 | HR 61 | Ht 70.0 in | Wt 233.5 lb

## 2020-04-21 DIAGNOSIS — F419 Anxiety disorder, unspecified: Secondary | ICD-10-CM | POA: Insufficient documentation

## 2020-04-21 MED ORDER — SERTRALINE HCL 100 MG PO TABS: 150.0000 mg | ORAL_TABLET | Freq: Every day | ORAL | 1 refills | Status: DC

## 2020-04-21 NOTE — Patient Instructions (Signed)

## 2020-04-21 NOTE — Progress Notes (Signed)
  Medication Management Clinic Visit Note  Patient: Kristi [name] MRN: [id number] Date of Birth: 1985/09/29 PCP: Lynnea Ferrier, MD   [name] 34 y.o. female presents for a follow up visit today after increasing her Zoloft dose to 100 mg daily approximately four weeks ago. States that she feels "happier" but is still struggling with the anxiety piece. Has had counseling with her pastor through church.   BP 109/74   Pulse 61   Ht 5\' 10"  (1.778 m)   Wt 233 lb 8 oz (105.9 kg)   LMP 04/10/2020 (Approximate)   Breastfeeding No   BMI 33.50 kg/m   Patient Information   Past Medical History:  Diagnosis Date  . Acid reflux   . Hypothyroidism   . Obesity   . Pap smear, low-risk 2011     No past surgical history on file.   Family History  Problem Relation Age of Onset  . Hypothyroidism Mother   . Hypothyroidism Maternal Grandmother   . Heart disease Paternal Grandfather   . Hypothyroidism Father     New Diagnoses (since last visit): none  Family Support: Good     Has family trip to [place] in January. Does not want to go. Her Mom and dad had been planning it for the last year. Since her dad passed she does not enjoy things that she used to.     Social History   Substance and Sexual Activity  Alcohol Use No      Social History   Tobacco Use  Smoking Status Never Smoker  Smokeless Tobacco Never Used      Health Maintenance  Topic Date Due  . COVID-19 Vaccine (1) Never done  . PAP SMEAR-Modifier  02/18/2023  . TETANUS/TDAP  06/17/2029  . INFLUENZA VACCINE  Completed  . Hepatitis C Screening  Completed  . HIV Screening  Completed   Flowsheet Row Office Visit from 04/21/2020 in [facility]  PHQ-9 Total Score 6     GAD 7 : Generalized Anxiety Score 04/21/2020 03/24/2020  Nervous, Anxious, on Edge 3 3  Control/stop worrying 3 0  Worry too much - different things 3 3  Trouble relaxing 0 3  Restless 0 1  Easily annoyed or irritable 2  3  Afraid - awful might happen 3 3  Total GAD 7 Score 14 16  Anxiety Difficulty Not difficult at all Somewhat difficult      Assessment and Plan:  Discussed increasing dose to 150 mg, if she does not like the way it makes her feel can go back to 10-mg. Encouraged continued use of counseling with her pastor and I encouraged her to consider seeing a medically trained counselor. She agrees. Will try medication increase. Follow up 4 wks for medication check or sooner if needed.   03/26/2020, CNM

## 2020-06-26 ENCOUNTER — Other Ambulatory Visit: Payer: Self-pay | Admitting: Orthopedic Surgery

## 2020-07-10 ENCOUNTER — Other Ambulatory Visit: Payer: Self-pay

## 2020-07-10 ENCOUNTER — Other Ambulatory Visit
Admission: RE | Admit: 2020-07-10 | Discharge: 2020-07-10 | Disposition: A | Discharge status: Home / Self Care | Payer: BC Managed Care – PPO | Source: Ambulatory Visit | Attending: Orthopedic Surgery | Admitting: Orthopedic Surgery

## 2020-07-10 HISTORY — DX: Anxiety disorder, unspecified: F41.9

## 2020-07-10 HISTORY — DX: Anemia, unspecified: D64.9

## 2020-07-10 HISTORY — DX: Family history of other specified conditions: Z84.89

## 2020-07-10 NOTE — Patient Instructions (Addendum)
Your procedure is scheduled on: Monday July 21, 2020. Report to Day Surgery inside [facility] 2nd floor (stop by Admissions desk first before getting on Elevator). To find out your arrival time please call [phone] between 1PM - 3PM on Friday July 18, 2020.   Remember: Instructions that are not followed completely may result in serious medical risk,  up to and including death, or upon the discretion of your surgeon and anesthesiologist your  surgery may need to be rescheduled.     _X__ 1. Do not eat food after midnight the night before your procedure.                 No chewing gum or hard candies. You may drink clear liquids up to 2 hours                 before you are scheduled to arrive for your surgery- DO not drink clear                 liquids within 2 hours of the start of your surgery.                 Clear Liquids include:  water, apple juice without pulp, clear Gatorade, G2 or                  Gatorade Zero (avoid Red/Purple/Blue), Black Coffee or Tea (Do not add                 anything to coffee or tea).  __X__2.   Complete the "Ensure Clear Pre-surgery Clear Carbohydrate Drink" provided to you, 2 hours before arrival. **If you are diabetic you will be provided with an alternative drink, Gatorade Zero or G2.  __X__3.  On the morning of surgery brush your teeth with toothpaste and water, you                may rinse your mouth with mouthwash if you wish.  Do not swallow any toothpaste of mouthwash.     _X__ 4.  No Alcohol for 24 hours before or after surgery.   _X__ 5.  Do Not Smoke or use e-cigarettes For 24 Hours Prior to Your Surgery.                 Do not use any chewable tobacco products for at least 6 hours prior to                 Surgery.  _X__  6.  Do not use any recreational drugs (marijuana, cocaine, heroin, ecstasy, MDMA or other)                For at least one week prior to your surgery.  Combination of these drugs with  anesthesia                May have life threatening results.  __X__  7.  Notify your doctor if there is any change in your medical condition      (cold, fever, infections).     Do not wear jewelry, make-up, hairpins, clips or nail polish. Do not wear lotions, powders, or perfumes. You may wear deodorant. Do not shave 48 hours prior to surgery. Men may shave face and neck. Do not bring valuables to the hospital.    [facility] is not responsible for any belongings or valuables.  Contacts, dentures or bridgework may not be worn into surgery. Leave your suitcase in the car.  After surgery it may be brought to your room. For patients admitted to the hospital, discharge time is determined by your treatment team.   Patients discharged the day of surgery will not be allowed to drive home.   Make arrangements for someone to be with you for the first 24 hours of your Same Day Discharge.   __X__ Take these medicines the morning of surgery with A SIP OF WATER:    1. levothyroxine (SYNTHROID) 150 MCG  2. pantoprazole (PROTONIX) 40 MG  ____ Fleet Enema (as directed)   __X__ Use CHG Soap (or wipes) as directed  ____ Use Benzoyl Peroxide Gel as instructed  ____ Use inhalers on the day of surgery  ____ Stop metformin 2 days prior to surgery    ____ Take 1/2 of usual insulin dose the night before surgery. No insulin the morning          of surgery.   __X__ Stop Anti-inflammatories such as ibuprofen (ADVIL), Aleve, naproxen, aspirin and or BC powders.    __X__ Stop supplements until after surgery. Such as Vitamin C, Biotin, and fish oil.  __X__ Do not start any herbal supplements before your procedure.    If you have any questions regarding your pre-procedure instructions,  Please call Pre-admit Testing at [phone].

## 2020-07-17 ENCOUNTER — Other Ambulatory Visit: Payer: Self-pay

## 2020-07-17 ENCOUNTER — Other Ambulatory Visit
Admission: RE | Admit: 2020-07-17 | Discharge: 2020-07-17 | Disposition: A | Discharge status: Home / Self Care | Payer: BC Managed Care – PPO | Source: Ambulatory Visit | Attending: Orthopedic Surgery | Admitting: Orthopedic Surgery

## 2020-07-17 DIAGNOSIS — Z01812 Encounter for preprocedural laboratory examination: Secondary | ICD-10-CM | POA: Insufficient documentation

## 2020-07-17 DIAGNOSIS — Z20822 Contact with and (suspected) exposure to covid-19: Secondary | ICD-10-CM | POA: Insufficient documentation

## 2020-07-17 LAB — SARS CORONAVIRUS 2 (TAT 6-24 HRS): SARS Coronavirus 2: NEGATIVE

## 2020-07-21 ENCOUNTER — Ambulatory Visit: Payer: BC Managed Care – PPO | Admitting: Anesthesiology

## 2020-07-21 ENCOUNTER — Other Ambulatory Visit: Payer: Self-pay

## 2020-07-21 ENCOUNTER — Ambulatory Visit: Payer: BC Managed Care – PPO

## 2020-07-21 ENCOUNTER — Ambulatory Visit
Admission: RE | Admit: 2020-07-21 | Discharge: 2020-07-21 | Disposition: A | Discharge status: Home / Self Care | Payer: BC Managed Care – PPO | Attending: Orthopedic Surgery | Admitting: Orthopedic Surgery

## 2020-07-21 ENCOUNTER — Encounter: Payer: Self-pay | Admitting: Orthopedic Surgery

## 2020-07-21 ENCOUNTER — Encounter
Admission: RE | Disposition: A | Discharge status: Home / Self Care | Payer: Self-pay | Source: Home / Self Care | Attending: Orthopedic Surgery

## 2020-07-21 DIAGNOSIS — Z79899 Other long term (current) drug therapy: Secondary | ICD-10-CM | POA: Insufficient documentation

## 2020-07-21 DIAGNOSIS — Z79890 Hormone replacement therapy (postmenopausal): Secondary | ICD-10-CM | POA: Insufficient documentation

## 2020-07-21 DIAGNOSIS — M2202 Recurrent dislocation of patella, left knee: Secondary | ICD-10-CM | POA: Diagnosis present

## 2020-07-21 DIAGNOSIS — R52 Pain, unspecified: Secondary | ICD-10-CM

## 2020-07-21 DIAGNOSIS — Z419 Encounter for procedure for purposes other than remedying health state, unspecified: Secondary | ICD-10-CM

## 2020-07-21 HISTORY — PX: MEDIAL PATELLOFEMORAL LIGAMENT REPAIR: SHX2020

## 2020-07-21 LAB — POCT PREGNANCY, URINE: Preg Test, Ur: NEGATIVE

## 2020-07-21 SURGERY — RECONSTRUCTION, LIGAMENT, MEDIAL PATELLOFEMORAL
Anesthesia: General | Site: Knee | Laterality: Left

## 2020-07-21 SURGERY — Surgical Case
Anesthesia: *Unknown

## 2020-07-21 MED ORDER — HYDROCODONE-ACETAMINOPHEN 5-325 MG PO TABS
1.0000 | ORAL_TABLET | Freq: Once | ORAL | Status: AC
  Administered 2020-07-21: 1 via ORAL

## 2020-07-21 MED ORDER — CEFAZOLIN SODIUM-DEXTROSE 2-4 GM/100ML-% IV SOLN
2.0000 g | INTRAVENOUS | Status: AC
  Administered 2020-07-21: 2 g via INTRAVENOUS

## 2020-07-21 MED ORDER — PROPOFOL 10 MG/ML IV BOLUS
INTRAVENOUS | Status: AC
  Filled 2020-07-21: qty 20

## 2020-07-21 MED ORDER — ORAL CARE MOUTH RINSE: 15.0000 mL | Freq: Once | OROMUCOSAL | Status: AC

## 2020-07-21 MED ORDER — LACTATED RINGERS IV SOLN: INTRAVENOUS | Status: DC

## 2020-07-21 MED ORDER — FENTANYL CITRATE (PF) 100 MCG/2ML IJ SOLN
INTRAMUSCULAR | Status: AC
  Administered 2020-07-21: 25 ug via INTRAVENOUS
  Filled 2020-07-21: qty 2

## 2020-07-21 MED ORDER — ROPIVACAINE HCL 5 MG/ML IJ SOLN
INTRAMUSCULAR | Status: DC | PRN
  Administered 2020-07-21: 25 mL via EPIDURAL

## 2020-07-21 MED ORDER — ONDANSETRON HCL 4 MG/2ML IJ SOLN
4.0000 mg | Freq: Once | INTRAMUSCULAR | Status: AC | PRN
  Administered 2020-07-21: 4 mg via INTRAVENOUS

## 2020-07-21 MED ORDER — LIDOCAINE HCL (PF) 1 % IJ SOLN
INTRAMUSCULAR | Status: AC
  Filled 2020-07-21: qty 5

## 2020-07-21 MED ORDER — ACETAMINOPHEN 500 MG PO TABS: 1000.0000 mg | ORAL_TABLET | Freq: Three times a day (TID) | ORAL | 2 refills | Status: AC

## 2020-07-21 MED ORDER — FENTANYL CITRATE (PF) 100 MCG/2ML IJ SOLN
INTRAMUSCULAR | Status: AC
  Filled 2020-07-21: qty 2

## 2020-07-21 MED ORDER — ONDANSETRON HCL 4 MG/2ML IJ SOLN
INTRAMUSCULAR | Status: AC
  Filled 2020-07-21: qty 2

## 2020-07-21 MED ORDER — HYDROCODONE-ACETAMINOPHEN 5-325 MG PO TABS
ORAL_TABLET | ORAL | Status: AC
  Filled 2020-07-21: qty 1

## 2020-07-21 MED ORDER — DEXAMETHASONE SODIUM PHOSPHATE 10 MG/ML IJ SOLN
INTRAMUSCULAR | Status: DC | PRN
  Administered 2020-07-21: 5 mg via INTRAVENOUS

## 2020-07-21 MED ORDER — HYDROCODONE-ACETAMINOPHEN 5-325 MG PO TABS: 1.0000 | ORAL_TABLET | ORAL | 0 refills | Status: DC | PRN

## 2020-07-21 MED ORDER — GLYCOPYRROLATE 0.2 MG/ML IJ SOLN
INTRAMUSCULAR | Status: DC | PRN
  Administered 2020-07-21: .2 mg via INTRAVENOUS

## 2020-07-21 MED ORDER — MIDAZOLAM HCL 2 MG/2ML IJ SOLN
INTRAMUSCULAR | Status: AC
  Filled 2020-07-21: qty 2

## 2020-07-21 MED ORDER — ACETAMINOPHEN 10 MG/ML IV SOLN
INTRAVENOUS | Status: DC | PRN
  Administered 2020-07-21: 1000 mg via INTRAVENOUS

## 2020-07-21 MED ORDER — ONDANSETRON HCL 4 MG/2ML IJ SOLN
INTRAMUSCULAR | Status: DC | PRN
  Administered 2020-07-21: 4 mg via INTRAVENOUS

## 2020-07-21 MED ORDER — CEFAZOLIN SODIUM-DEXTROSE 2-4 GM/100ML-% IV SOLN
INTRAVENOUS | Status: AC
  Filled 2020-07-21: qty 100

## 2020-07-21 MED ORDER — LIDOCAINE HCL (CARDIAC) PF 100 MG/5ML IV SOSY
PREFILLED_SYRINGE | INTRAVENOUS | Status: DC | PRN
  Administered 2020-07-21: 100 mg via INTRAVENOUS

## 2020-07-21 MED ORDER — ONDANSETRON 4 MG PO TBDP
4.0000 mg | ORAL_TABLET | Freq: Three times a day (TID) | ORAL | 0 refills | Status: DC | PRN
Start: 2020-07-21 — End: 2020-10-04

## 2020-07-21 MED ORDER — FENTANYL CITRATE (PF) 100 MCG/2ML IJ SOLN
INTRAMUSCULAR | Status: DC | PRN
  Administered 2020-07-21: 25 ug via INTRAVENOUS
  Administered 2020-07-21: 50 ug via INTRAVENOUS
  Administered 2020-07-21 (×3): 25 ug via INTRAVENOUS

## 2020-07-21 MED ORDER — ACETAMINOPHEN 10 MG/ML IV SOLN
INTRAVENOUS | Status: AC
  Filled 2020-07-21: qty 100

## 2020-07-21 MED ORDER — CHLORHEXIDINE GLUCONATE 0.12 % MT SOLN: 15.0000 mL | Freq: Once | OROMUCOSAL | Status: AC

## 2020-07-21 MED ORDER — LIDOCAINE HCL (PF) 1 % IJ SOLN
INTRAMUSCULAR | Status: DC | PRN
  Administered 2020-07-21: 3 mL via SUBCUTANEOUS

## 2020-07-21 MED ORDER — EPHEDRINE SULFATE 50 MG/ML IJ SOLN
INTRAMUSCULAR | Status: DC | PRN
  Administered 2020-07-21: 5 mg via INTRAVENOUS

## 2020-07-21 MED ORDER — LIDOCAINE HCL (PF) 2 % IJ SOLN
INTRAMUSCULAR | Status: AC
  Filled 2020-07-21: qty 5

## 2020-07-21 MED ORDER — MIDAZOLAM HCL 2 MG/2ML IJ SOLN
INTRAMUSCULAR | Status: DC | PRN
  Administered 2020-07-21: 2 mg via INTRAVENOUS

## 2020-07-21 MED ORDER — FENTANYL CITRATE (PF) 100 MCG/2ML IJ SOLN
25.0000 ug | INTRAMUSCULAR | Status: DC | PRN
Start: 2020-07-21 — End: 2020-07-21
  Administered 2020-07-21: 25 ug via INTRAVENOUS

## 2020-07-21 MED ORDER — PROPOFOL 10 MG/ML IV BOLUS
INTRAVENOUS | Status: DC | PRN
  Administered 2020-07-21: 200 mg via INTRAVENOUS

## 2020-07-21 MED ORDER — ROPIVACAINE HCL 5 MG/ML IJ SOLN
INTRAMUSCULAR | Status: AC
  Filled 2020-07-21: qty 30

## 2020-07-21 MED ORDER — ASPIRIN EC 325 MG PO TBEC: 325.0000 mg | DELAYED_RELEASE_TABLET | Freq: Every day | ORAL | 0 refills | Status: AC

## 2020-07-21 MED ORDER — CHLORHEXIDINE GLUCONATE 0.12 % MT SOLN
OROMUCOSAL | Status: AC
  Administered 2020-07-21: 15 mL via OROMUCOSAL
  Filled 2020-07-21: qty 15

## 2020-07-21 MED ORDER — IBUPROFEN 800 MG PO TABS: 800.0000 mg | ORAL_TABLET | Freq: Three times a day (TID) | ORAL | 1 refills | Status: AC

## 2020-07-21 SURGICAL SUPPLY — 88 items
ADAPTER IRRIG TUBE 2 SPIKE SOL (ADAPTER) ×4 IMPLANT
ADPR TBG 2 SPK PMP STRL ASCP (ADAPTER) ×2
ANCH SUT Q-FX 1.8 BLU (Anchor) ×2 IMPLANT
ANCH SUT Q-FX 2.8 (Anchor) ×1 IMPLANT
ANCHOR ALL-SUT Q-FIX 1.8 BLUE (Anchor) ×4 IMPLANT
ANCHOR ALL-SUT Q-FIX 2.8 (Anchor) ×2 IMPLANT
APL PRP STRL LF DISP 70% ISPRP (MISCELLANEOUS) ×2
BASIN GRAD PLASTIC 32OZ STRL (MISCELLANEOUS) ×2 IMPLANT
BLADE SURG 15 STRL LF DISP TIS (BLADE) ×2 IMPLANT
BLADE SURG 15 STRL SS (BLADE) ×4
BLADE SURG SZ10 CARB STEEL (BLADE) ×2 IMPLANT
BLADE SURG SZ11 CARB STEEL (BLADE) ×2 IMPLANT
BNDG COHESIVE 4X5 TAN STRL (GAUZE/BANDAGES/DRESSINGS) ×2 IMPLANT
BNDG COHESIVE 6X5 TAN STRL LF (GAUZE/BANDAGES/DRESSINGS) ×2 IMPLANT
BNDG ESMARK 6X12 TAN STRL LF (GAUZE/BANDAGES/DRESSINGS) ×2 IMPLANT
BRUSH SCRUB EZ  4% CHG (MISCELLANEOUS) ×1
BRUSH SCRUB EZ 4% CHG (MISCELLANEOUS) ×1 IMPLANT
BUR BR 5.5 12 FLUTE (BURR) IMPLANT
BUR RADIUS 4.0X18.5 (BURR) ×4 IMPLANT
CHLORAPREP W/TINT 26 (MISCELLANEOUS) ×4 IMPLANT
CLEANER CAUTERY TIP 5X5 PAD (MISCELLANEOUS) ×1 IMPLANT
COOLER POLAR GLACIER W/PUMP (MISCELLANEOUS) ×2 IMPLANT
COVER BACK TABLE REUSABLE LG (DRAPES) ×2 IMPLANT
COVER WAND RF STERILE (DRAPES) ×2 IMPLANT
CUFF TOURN SGL QUICK 24 (TOURNIQUET CUFF)
CUFF TOURN SGL QUICK 30 (TOURNIQUET CUFF)
CUFF TRNQT CYL 24X4X16.5-23 (TOURNIQUET CUFF) IMPLANT
CUFF TRNQT CYL 30X4X21-28X (TOURNIQUET CUFF) IMPLANT
DRAPE 3/4 80X56 (DRAPES) ×4 IMPLANT
DRAPE ARTHRO LIMB 89X125 STRL (DRAPES) ×2 IMPLANT
DRAPE C-ARM XRAY 36X54 (DRAPES) ×2 IMPLANT
DRAPE C-ARMOR (DRAPES) ×2 IMPLANT
DRAPE FLUOR MINI C-ARM 54X84 (DRAPES) ×2 IMPLANT
DRAPE IMP U-DRAPE 54X76 (DRAPES) ×2 IMPLANT
DRAPE ORTHO SPLIT 77X108 STRL (DRAPES) ×2
DRAPE POUCH INSTRU U-SHP 10X18 (DRAPES) ×2 IMPLANT
DRAPE SURG ORHT 6 SPLT 77X108 (DRAPES) ×1 IMPLANT
DRILL FLIPCUTTER III 6-12 (ORTHOPEDIC DISPOSABLE SUPPLIES) IMPLANT
DRSG TEGADERM 2-3/8X2-3/4 SM (GAUZE/BANDAGES/DRESSINGS) ×2 IMPLANT
ELECT REM PT RETURN 9FT ADLT (ELECTROSURGICAL) ×2
ELECTRODE REM PT RTRN 9FT ADLT (ELECTROSURGICAL) ×1 IMPLANT
FLIPCUTTER III 6-12 AR-1204FF (ORTHOPEDIC DISPOSABLE SUPPLIES)
GAUZE SPONGE 4X4 12PLY STRL (GAUZE/BANDAGES/DRESSINGS) ×2 IMPLANT
GAUZE XEROFORM 1X8 LF (GAUZE/BANDAGES/DRESSINGS) ×2 IMPLANT
GLOVE SRG 8 PF TXTR STRL LF DI (GLOVE) ×1 IMPLANT
GLOVE SURG SYN 8.0 (GLOVE) ×2 IMPLANT
GLOVE SURG UNDER POLY LF SZ8 (GLOVE) ×2
GOWN STRL REUS W/ TWL LRG LVL3 (GOWN DISPOSABLE) ×1 IMPLANT
GOWN STRL REUS W/ TWL XL LVL3 (GOWN DISPOSABLE) ×1 IMPLANT
GOWN STRL REUS W/TWL LRG LVL3 (GOWN DISPOSABLE) ×2
GOWN STRL REUS W/TWL XL LVL3 (GOWN DISPOSABLE) ×2
GRADUATE 1200CC STRL 31836 (MISCELLANEOUS) ×2 IMPLANT
GUIDEWIRE 1.2MMX18 (WIRE) ×2 IMPLANT
HANDLE YANKAUER SUCT BULB TIP (MISCELLANEOUS) ×2 IMPLANT
IV LACTATED RINGER IRRG 3000ML (IV SOLUTION) ×12
IV LR IRRIG 3000ML ARTHROMATIC (IV SOLUTION) ×6 IMPLANT
KIT SUTURE 1.8 Q-FIX DISP (KITS) ×4 IMPLANT
KIT TURNOVER KIT A (KITS) ×2 IMPLANT
MANIFOLD NEPTUNE II (INSTRUMENTS) ×4 IMPLANT
MAT ABSORB  FLUID 56X50 GRAY (MISCELLANEOUS) ×2
MAT ABSORB FLUID 56X50 GRAY (MISCELLANEOUS) ×2 IMPLANT
NEEDLE HYPO 22GX1.5 SAFETY (NEEDLE) ×2 IMPLANT
PACK ARTHROSCOPY KNEE (MISCELLANEOUS) ×2 IMPLANT
PAD ABD DERMACEA PRESS 5X9 (GAUZE/BANDAGES/DRESSINGS) ×4 IMPLANT
PAD CLEANER CAUTERY TIP 5X5 (MISCELLANEOUS) ×1
PAD WRAPON POLAR KNEE (MISCELLANEOUS) ×1 IMPLANT
PENCIL ELECTRO HAND CTR (MISCELLANEOUS) ×2 IMPLANT
PENCIL SMOKE EVACUATOR (MISCELLANEOUS) ×2 IMPLANT
SET TUBE SUCT SHAVER OUTFL 24K (TUBING) ×2 IMPLANT
SET TUBE TIP INTRA-ARTICULAR (MISCELLANEOUS) ×2 IMPLANT
SPONGE LAP 18X18 RF (DISPOSABLE) ×4 IMPLANT
STRIP CLOSURE SKIN 1/2X4 (GAUZE/BANDAGES/DRESSINGS) ×2 IMPLANT
SUCTION FRAZIER HANDLE 10FR (MISCELLANEOUS)
SUCTION TUBE FRAZIER 10FR DISP (MISCELLANEOUS) IMPLANT
SUT ETHILON 3-0 FS-10 30 BLK (SUTURE) ×2
SUT FIBERWIRE #2 38 T-5 BLUE (SUTURE) ×4
SUT MNCRL AB 4-0 PS2 18 (SUTURE) ×2 IMPLANT
SUT VIC AB 0 CT1 36 (SUTURE) ×2 IMPLANT
SUT VIC AB 2-0 CT1 27 (SUTURE) ×2
SUT VIC AB 2-0 CT1 TAPERPNT 27 (SUTURE) ×1 IMPLANT
SUTURE EHLN 3-0 FS-10 30 BLK (SUTURE) ×1 IMPLANT
SUTURE FIBERWR #2 38 T-5 BLUE (SUTURE) ×2 IMPLANT
SYR BULB IRRIG 60ML STRL (SYRINGE) ×2 IMPLANT
TENDON SEMI-TENDINOSUS (Bone Implant) ×2 IMPLANT
TRAY FOLEY SLVR 16FR LF STAT (SET/KITS/TRAYS/PACK) ×2 IMPLANT
TUBING ARTHRO INFLOW-ONLY STRL (TUBING) ×2 IMPLANT
WAND WEREWOLF FLOW 90D (MISCELLANEOUS) IMPLANT
WRAPON POLAR PAD KNEE (MISCELLANEOUS) ×2

## 2020-07-21 NOTE — Anesthesia Preprocedure Evaluation (Signed)
Anesthesia Evaluation  Patient identified by MRN, date of birth, ID band Patient awake    Reviewed: Allergy & Precautions, H&P , NPO status , Patient's Chart, lab work & pertinent test results, reviewed documented beta blocker date and time   Airway Mallampati: II  TM Distance: >3 FB Neck ROM: full    Dental  (+) Teeth Intact   Pulmonary neg pulmonary ROS,    Pulmonary exam normal        Cardiovascular Exercise Tolerance: Good negative cardio ROS Normal cardiovascular exam Rate:Normal     Neuro/Psych Anxiety negative neurological ROS  negative psych ROS   GI/Hepatic Neg liver ROS, GERD  Medicated,  Endo/Other  Hypothyroidism   Renal/GU negative Renal ROS  negative genitourinary   Musculoskeletal   Abdominal   Peds  Hematology  (+) Blood dyscrasia, anemia ,   Anesthesia Other Findings   Reproductive/Obstetrics negative OB ROS                             Anesthesia Physical Anesthesia Plan  ASA: II  Anesthesia Plan: General ETT   Post-op Pain Management:  Regional for Post-op pain   Induction:   PONV Risk Score and Plan: 4 or greater  Airway Management Planned:   Additional Equipment:   Intra-op Plan:   Post-operative Plan:   Informed Consent: I have reviewed the patients History and Physical, chart, labs and discussed the procedure including the risks, benefits and alternatives for the proposed anesthesia with the patient or authorized representative who has indicated his/her understanding and acceptance.       Plan Discussed with: CRNA  Anesthesia Plan Comments: (Pt desires to proceed with post op adductor canal block in pacu if we feel she is having difficulty with pain relief.  ja)        Anesthesia Quick Evaluation

## 2020-07-21 NOTE — Discharge Instructions (Signed)
Knee Arthroscopy/Medial Patellofemoral Ligament Reconstruction (MPFL) Surgery   Post-Op Instructions   1. Bracing or crutches: Crutches will be provided at the time of discharge from the surgery center.    2. Ice: You may be provided with a device [facility]) that allows you to ice the affected area effectively. Otherwise you can ice manually.   3. Driving:  Driving: Off all narcotic pain meds when operating vehicle   1 week for automatic cars, left leg surgery  2-4 weeks for standard/manual cars or right leg surgery   4. Activity: Ankle pumps several times an hour while awake to prevent blood clots. Weight bearing: full weight is permitted with brace locked in extension for at least 2 weeks. Brace will be unlocked for ambulation once you have appropriate control of your quadriceps muscle -- your physical therapist will assist with this. Use crutches if there is pain and limping. Bending and straightening the knee is unlimited. Elevate knee above heart level as much as possible for one week. Avoid standing more than 5 minutes (consecutively) for the first week. No exercise involving the knee until cleared by the surgeon or physical therapist. Ideally, you should avoid long distance travel for 4 weeks.   5. Medications:  - You have been provided a prescription for narcotic pain medicine. After surgery, take 1-2 narcotic tablets every 4 hours if needed for severe pain.  - A prescription for anti-nausea medication will be provided in case the narcotic medicine causes nausea - take 1 tablet every 6 hours only if nauseated.  - Take ibuprofen 800 mg every 8 hours with food to reduce post-operative knee swelling. DO NOT STOP IBUPROFEN POST-OP UNTIL INSTRUCTED TO DO SO at first post-op office visit (10-14 days after surgery).  - Take enteric coated aspirin 325 mg once daily for 2 weeks to prevent blood clots.  -Take tylenol 1000mg  every 8 hours for pain.  May stop tylenol when are having minimal pain.    If you are taking prescription medication for anxiety, depression, insomnia, muscle spasm, chronic pain, or for attention deficit disorder you are advised that you are at a higher risk of adverse effects with use of narcotics post-op, including narcotic addiction/dependence, depressed breathing, death. If you use non-prescribed substances: alcohol, marijuana, cocaine, heroin, methamphetamines, etc., you are at a higher risk of adverse effects with use of narcotics post-op, including narcotic addiction/dependence, depressed breathing, death. You are advised that taking > 50 morphine milligram equivalents (MME) of narcotic pain medication per day results in twice the risk of overdose or death. For your prescription provided: oxycodone 5 mg - taking more than 6 tablets per day. Be advised that we will prescribe narcotics short-term, for acute post-operative pain only - 1 week for minor operations such as knee arthroscopy for meniscus tear resection, and 3 weeks for major operations such as knee repair/reconstruction surgeries.   6. Bandages: The physical therapist should change the bandages at the first post-op appointment. If needed, the dressing supplies have been provided to you.   7. Physical Therapy: 2 times per week for the first 4 weeks, then 1-2 times per week from weeks 4-8 post-op. Therapy typically starts on post operative Day 3 or 4. You have been provided an order for physical therapy. The therapist will provide home exercises.   8. Work/School: May return when able to tolerate standing for greater than 2 hours and off of narcotic pain medicaitons   9. Post-Op Appointments: Your first post-op appointment will be with Dr.  Patel in approximately 2 weeks time.    If you find that they have not been scheduled please call the Orthopaedic Appointment front desk at [phone].    AMBULATORY SURGERY  DISCHARGE INSTRUCTIONS   1) The drugs that you were given will stay in your system until  tomorrow so for the next 24 hours you should not:  A) Drive an automobile B) Make any legal decisions C) Drink any alcoholic beverage   2) You may resume regular meals tomorrow.  Today it is better to start with liquids and gradually work up to solid foods.  You may eat anything you prefer, but it is better to start with liquids, then soup and crackers, and gradually work up to solid foods.   3) Please notify your doctor immediately if you have any unusual bleeding, trouble breathing, redness and pain at the surgery site, drainage, fever, or pain not relieved by medication.    4) Additional Instructions:        Please contact your physician with any problems or Same Day Surgery at [phone], Monday through Friday 6 am to 4 pm, or [place] at [facility] number at [phone].

## 2020-07-21 NOTE — Anesthesia Procedure Notes (Signed)
Anesthesia Regional Block: Adductor canal block   Pre-Anesthetic Checklist: ,, timeout performed, Correct Patient, Correct Site, Correct Laterality, Correct Procedure, Correct Position, site marked, Risks and benefits discussed,  Surgical consent,  Pre-op evaluation,  At surgeon's request and post-op pain management  Laterality: Lower and Left  Prep: chloraprep       Needles:  Injection technique: Single-shot  Needle Type: Echogenic Needle     Needle Length: 9cm  Needle Gauge: 21     Additional Needles:   Procedures:,,,, ultrasound used (permanent image in chart),,,,  Narrative:  Injection made incrementally with aspirations every 5 mL.  Performed by: Personally  Anesthesiologist: Piscitello, Cleda Mccreedy, MD  Additional Notes: Patient consented for risk and benefits of nerve block including but not limited to nerve damage, failed block, bleeding and infection.  Patient voiced understanding.  Functioning IV was confirmed and monitors were applied.  Timeout done prior to procedure and prior to any sedation being given to the patient.  Patient confirmed procedure site prior to any sedation given to the patient.  A 79mm 22ga Stimuplex needle was used. Sterile prep,hand hygiene and sterile gloves were used.  Minimal sedation used for procedure.  No paresthesia endorsed by patient during the procedure.  Negative aspiration and negative test dose prior to incremental administration of local anesthetic. The patient tolerated the procedure well with no immediate complications.

## 2020-07-21 NOTE — H&P (Signed)
Paper H&P to be scanned into permanent record. H&P reviewed. No significant changes noted.  

## 2020-07-21 NOTE — Op Note (Signed)
OPERATIVE NOTE  07/21/2020   PRE-OP DIAGNOSIS:  1. Left patella degenerative changes 2. Left patella recurrent dislocation 3. Left patella lateral tilt   POST-OP DIAGNOSIS:  1. Left patella degenerative changes 2. Left patella recurrent dislocation 3. Left patella lateral tilt  PROCEDURES:  1. Left knee medial patellofemoral ligament reconstruction using allograft 2. Left knee open retinacular release and lengthening 3. Left arthroscopic patella chondroplasty  SURGEON:Mamadou Breon Molinda Bailiff, MD  ASSISTANT(S): Dedra Skeens, PA; Melven Sartorius, PA-S  ANESTHESIA: Gen  TOTAL IV FLUIDS: see anesthesia record  ESTIMATED BLOOD LOSS: 1cc  TOURNIQUET TIME: 76 min  SPECIMENS: None  IMPLANTS:  - [employer] Qfix mini 1.55mm all suture anchor x2 - [employer] Qfix 2.84mm all suture anchor x1 - Semitendinosus allograft   COMPLICATIONS: None apparent.  INDICATIONS: Kristi [name] is a 35 y.o. female with Left knee pain and recurrent patellar instability. The patient feels that she no longer can trust the knee to perform any day-to-day or recreational activities without pain or instability sensations. She has undergone extensive nonoperative management without adequate improvement in symptoms.  After discussion of risks, benefits, and alternatives to surgery, the patient elected to proceed with the hopes of preventing recurrent patellar dislocation.  OPERATIVE FINDINGS:    Examination under anesthesia: A careful examination under anesthesia was performed.  Passive range of motion was: Hyperextension: 2.  Extension: 0.  Flexion: 130.  Lachman: normal. Pivot Shift: normal.  Posterior drawer: normal.  Varus stability in full extension: normal.  Varus stability in 30 degrees of flexion: normal.  Valgus stability in full extension: normal.  Valgus stability in 30 degrees of flexion: normal. Patella: 3 quadrants lateral mobility with patella self-reducing, 2 quadrants medial mobility,  significant lateral patellar tilt present, no "J" sign   Intra-operative findings: A thorough arthroscopic examination of the knee was performed.  The findings are: 1. Suprapatellar pouch: normal 2. Undersurface of median ridge: Significant softening of cartilage with extension from medial facet lesion as described below.  3. Medial patellar facet: Significant fissuring and cracking of patellar cartilage down to subchondral bone consistent with grade 3-4 degenerative changes measuring approximately 8 x 8 mm with extension to the median ridge 4. Lateral patellar facet: Softening of cartilage without definitive cracking or fissuring.  Significant lateral patellar tilt with lateral patellar overhang present 5. Trochlea: Normal 6. Lateral gutter/popliteus tendon: Normal 7. Hoffa's fat pad: Normal 8. Medial gutter/plica: Normal 9. ACL: Normal.  Normal 10. PCL: Normal 11. Medial meniscus: Normal 12. Medial compartment cartilage: Normal 13. Lateral meniscus: Normal 14. Lateral compartment cartilage: Normal  DETAILS OF PROCEDURE: The patient was identified in the preoperative holding area and appropriate informed consent was obtained.  The patient was brought to the operating room.  The patient was placed supine on the OR table.  Anesthesia was administered.  A tourniquet was placed on the thigh. The operative extremity was prescrubbed with Hibiclens and alcohol, prepped with ChloraPrep, and draped in the usual sterile fashion. The patient was given preoperative IV antibiotics and a surgical time-out confirming patient identity, procedure, and laterality was performed.   Tourniquet was inflated after Esmarch bandage was used to exsanguinate the leg. First, the arthroscopic portion was performed. A standard anterolateral portal was made with an 11 blade. A standard anteromedial portal was made using needle localization. A diagnostic arthroscopy was performed with the above findings.  A gentle  chondroplasty of the patella was performed owith an oscillating shaver such that there were no loose fragments  of cartilage. This concluded the arthroscopic portion of the procedure.  A midline incision was made over the patella. Flaps medially and laterally were elevated to access the the medial and lateral borders of the patella. Next, on the lateral side just off the lateral border of the patella, Layers 1&2 were dissected free from Layer 3 (capsule). This dissection was from proximally just distal to the vastus lateralis insertion to distally at the level of the anterolateral portal. The lateral capsule was incised ~1.5cm posterior to the the incision in Layers 1 & 2 off the lateral patellar border. This consisted of the open lateral retinacular release. The lateral lengthening was performed by suturing the posterior border of layers 1&2 to the anterior border of layer 3, thus achieving ~1.5cm lateral lengthening. This significantly improved the lateral patellar tilt.   Next, the knee was placed in approximately 30 degrees knee flexion.  Layer 2 was dissected free from Layer 3 (capsule) at the medial aspect of the mid-patella. A blunt Tresa Endo was passed down to the region of the MPFL origin between the adductor tubercle and medial epicondyle. Then, Using bovie electrocautery, dissection onto the medial surface of the patella was performed. A rongeur was used to create a trough at the midportion of the patella. Using fluoroscopy, two Qfix Mini all suture anchors were placed into the trough created on the patella. The first was placed at the midportion of the patella in the superior/inferior aspect, and the second was placed ~1.5cm superior near the angle of the patella. Next, Schottle's point was localized with fluoroscopy and an incision was made over it. Dissection was carried down with bovie electrocautery and fascia was incised longitudinally.  A drill guide for a Q fix double loaded anchor was placed over  Schottle's point.  This was confirmed fluoroscopically.  Then, aiming approximately 20 degrees proximal and anterior, the Q fix anchor was placed.  The semitendinosus allograft was marked at the midportion.  One limb of each of the 2 sutures was placed above the graft and the other limb was placed below the graft.  A circumferential, loop stitch was tied on either side of the central mark on the allograft.  This appropriately brought the graft to the anchor at Schottle's point.  A Kelly clamp was passed in between layers 2 and 3 to retreive both free ends of the graft. The graft limbs were held at a resting tension. One limb of the superior patella anchor was passed in a non-locking Krakow fashion through one free end of the graft and the second limb was passed through the graft in a simple fashion. This limb became the post and was used to shuttle the graft down to the anchor. This was then tied with alternating half-hitches. Similarly, this was performed for the more inferior suture anchor and the other free end of the graft. This achieved appropriate constraint of the patella. The knee was taken through a range of motion and full hyperextension and 90 degrees flexion were easily achieved. There was no patellar maltracking. Patella could not be dislocated laterally.  All wounds were thoroughly irrigated. The deep fascial layer for the medial incision and deeper layers of the medial patellar incision were closed with 0 Vicryl. 2-0 Vicryl was used to close the subdermal layers of both incisions.  Skin was closed with 4-0 Monocryl in a running subcuticular fashion and a layer of Dermabond.  Local anesthetic was injected around both of these incisions. Portal sites were closed with 3-0 Nylon.  Xeroform was applied to portal sites. Leg was wrapped in cotton and bias wrap.  Polar Care and hinged knee brace locked at 0 degrees were applied.  The patient was brought to PACU in stable condition.  Of note, assistance  from a PA was essential to performing the surgery.  PA was present for the entire surgery.  PA assisted with patient positioning, retraction, instrumentation, and wound closure. The surgery would have been more difficult and had longer operative time without PA assistance.          DISPOSITION: PACU - hemodynamically stable.  POSTOPERATIVE PLAN: The patient will be discharged home today. The patient will be weightbearing as tolerated with the brace locked in extension. Physical therapy to begin post-op day 3-4 for knee range of motion, patellar mobilization and scar mobilization.  ASA 325 mg daily x2 weeks for DVT prophylaxis. Return to clinic 10-14 days as scheduled.

## 2020-07-21 NOTE — Anesthesia Procedure Notes (Signed)
Procedure Name: MAC Performed by: Fredderick Phenix, CRNA Pre-anesthesia Checklist: Patient identified, Emergency Drugs available, Suction available and Patient being monitored Patient Re-evaluated:Patient Re-evaluated prior to induction Oxygen Delivery Method: Circle system utilized Preoxygenation: Pre-oxygenation with 100% oxygen Induction Type: IV induction Ventilation: Mask ventilation without difficulty LMA: LMA inserted LMA Size: 3.5 Tube type: Oral Number of attempts: 1 Airway Equipment and Method: Stylet and Oral airway Placement Confirmation: ETT inserted through vocal cords under direct vision,  positive ETCO2 and breath sounds checked- equal and bilateral Tube secured with: Tape Dental Injury: Teeth and Oropharynx as per pre-operative assessment

## 2020-07-21 NOTE — Transfer of Care (Signed)
Immediate Anesthesia Transfer of Care Note  Patient: KENLY HENCKEL  Procedure(s) Performed: Left medial patella femoral ligament reconstruction using semitendinosus allograft, lateral retinacular release and lengthening with preceding knee arthroscopy and chondroplasty - Dedra Skeens to Assist (Left Knee)  Patient Location: PACU  Anesthesia Type:General  Level of Consciousness: awake and alert   Airway & Oxygen Therapy: Patient Spontanous Breathing and Patient connected to nasal cannula oxygen  Post-op Assessment: Report given to RN and Post -op Vital signs reviewed and stable  Post vital signs: Reviewed and stable  Last Vitals:  Vitals Value Taken Time  BP 106/64 07/21/20 1002  Temp    Pulse 73 07/21/20 1005  Resp 10 07/21/20 1005  SpO2 99 % 07/21/20 1005  Vitals shown include unvalidated device data.  Last Pain:  Vitals:   07/21/20 0611  TempSrc: Temporal  PainSc: 0-No pain         Complications: No complications documented.

## 2020-07-22 ENCOUNTER — Encounter: Payer: Self-pay | Admitting: Orthopedic Surgery

## 2020-07-23 NOTE — Anesthesia Postprocedure Evaluation (Signed)
Anesthesia Post Note  Patient: PAYSLIE MCCAIG  Procedure(s) Performed: Left medial patella femoral ligament reconstruction using semitendinosus allograft, lateral retinacular release and lengthening with preceding knee arthroscopy and chondroplasty - Dedra Skeens to Assist (Left Knee)  Patient location during evaluation: PACU Anesthesia Type: General Level of consciousness: awake and alert Pain management: pain level controlled Vital Signs Assessment: post-procedure vital signs reviewed and stable Respiratory status: spontaneous breathing, nonlabored ventilation, respiratory function stable and patient connected to nasal cannula oxygen Cardiovascular status: blood pressure returned to baseline and stable Postop Assessment: no apparent nausea or vomiting Anesthetic complications: no   No complications documented.   Last Vitals:  Vitals:   07/21/20 1155 07/21/20 1308  BP: 117/76 117/71  Pulse: 81 63  Resp: 18   Temp: (!) 35.9 C   SpO2: 100% 100%    Last Pain:  Vitals:   07/22/20 0816  TempSrc:   PainSc: 4                  Yevette Edwards

## 2020-10-04 ENCOUNTER — Emergency Department
Admission: EM | Admit: 2020-10-04 | Discharge: 2020-10-04 | Disposition: A | Discharge status: Home / Self Care | Payer: BC Managed Care – PPO | Attending: Emergency Medicine | Admitting: Emergency Medicine

## 2020-10-04 ENCOUNTER — Other Ambulatory Visit: Payer: Self-pay

## 2020-10-04 ENCOUNTER — Emergency Department: Payer: BC Managed Care – PPO

## 2020-10-04 ENCOUNTER — Encounter: Payer: Self-pay | Admitting: Intensive Care

## 2020-10-04 DIAGNOSIS — N390 Urinary tract infection, site not specified: Secondary | ICD-10-CM | POA: Diagnosis not present

## 2020-10-04 DIAGNOSIS — Z8616 Personal history of COVID-19: Secondary | ICD-10-CM | POA: Diagnosis not present

## 2020-10-04 DIAGNOSIS — R109 Unspecified abdominal pain: Secondary | ICD-10-CM | POA: Diagnosis present

## 2020-10-04 DIAGNOSIS — E039 Hypothyroidism, unspecified: Secondary | ICD-10-CM | POA: Insufficient documentation

## 2020-10-04 DIAGNOSIS — Z79899 Other long term (current) drug therapy: Secondary | ICD-10-CM | POA: Diagnosis not present

## 2020-10-04 LAB — CBC WITH DIFFERENTIAL/PLATELET
Abs Immature Granulocytes: 0.04 10*3/uL (ref 0.00–0.07)
Basophils Absolute: 0 10*3/uL (ref 0.0–0.1)
Basophils Relative: 0 %
Eosinophils Absolute: 0.1 10*3/uL (ref 0.0–0.5)
Eosinophils Relative: 1 %
HCT: 37.3 % (ref 36.0–46.0)
Hemoglobin: 12 g/dL (ref 12.0–15.0)
Immature Granulocytes: 0 %
Lymphocytes Relative: 11 %
Lymphs Abs: 1.2 10*3/uL (ref 0.7–4.0)
MCH: 27 pg (ref 26.0–34.0)
MCHC: 32.2 g/dL (ref 30.0–36.0)
MCV: 84 fL (ref 80.0–100.0)
Monocytes Absolute: 1.2 10*3/uL — ABNORMAL HIGH (ref 0.1–1.0)
Monocytes Relative: 11 %
Neutro Abs: 8.3 10*3/uL — ABNORMAL HIGH (ref 1.7–7.7)
Neutrophils Relative %: 77 %
Platelets: 263 10*3/uL (ref 150–400)
RBC: 4.44 MIL/uL (ref 3.87–5.11)
RDW: 14.2 % (ref 11.5–15.5)
WBC: 10.8 10*3/uL — ABNORMAL HIGH (ref 4.0–10.5)
nRBC: 0 % (ref 0.0–0.2)

## 2020-10-04 LAB — LACTIC ACID, PLASMA
Lactic Acid, Venous: 1.2 mmol/L (ref 0.5–1.9)
Lactic Acid, Venous: 1.1 mmol/L (ref 0.5–1.9)

## 2020-10-04 LAB — COMPREHENSIVE METABOLIC PANEL
ALT: 30 U/L (ref 0–44)
AST: 23 U/L (ref 15–41)
Albumin: 3.9 g/dL (ref 3.5–5.0)
Alkaline Phosphatase: 65 U/L (ref 38–126)
Anion gap: 7 (ref 5–15)
BUN: 10 mg/dL (ref 6–20)
CO2: 26 mmol/L (ref 22–32)
Calcium: 9.2 mg/dL (ref 8.9–10.3)
Chloride: 104 mmol/L (ref 98–111)
Creatinine, Ser: 0.7 mg/dL (ref 0.44–1.00)
GFR, Estimated: >60 mL/min (ref >60)
Glucose, Bld: 97 mg/dL (ref 70–99)
Potassium: 4.6 mmol/L (ref 3.5–5.1)
Sodium: 137 mmol/L (ref 135–145)
Total Bilirubin: 0.4 mg/dL (ref 0.3–1.2)
Total Protein: 7.9 g/dL (ref 6.5–8.1)

## 2020-10-04 LAB — URINALYSIS, COMPLETE (UACMP) WITH MICROSCOPIC
Bacteria, UA: NONE SEEN
Bilirubin Urine: NEGATIVE
Glucose, UA: NEGATIVE mg/dL
Ketones, ur: NEGATIVE mg/dL
Nitrite: NEGATIVE
Protein, ur: NEGATIVE mg/dL
Specific Gravity, Urine: 1.017 (ref 1.005–1.030)
pH: 7 (ref 5.0–8.0)

## 2020-10-04 LAB — PREGNANCY, URINE: Preg Test, Ur: NEGATIVE

## 2020-10-04 MED ORDER — SODIUM CHLORIDE 0.9 % IV SOLN
1.0000 g | Freq: Once | INTRAVENOUS | Status: AC
  Administered 2020-10-04: 1 g via INTRAVENOUS
  Filled 2020-10-04: qty 10

## 2020-10-04 MED ORDER — SODIUM CHLORIDE 0.9 % IV BOLUS
1000.0000 mL | Freq: Once | INTRAVENOUS | Status: AC
  Administered 2020-10-04: 1000 mL via INTRAVENOUS

## 2020-10-04 MED ORDER — ONDANSETRON HCL 4 MG/2ML IJ SOLN
4.0000 mg | Freq: Once | INTRAMUSCULAR | Status: AC
  Administered 2020-10-04: 4 mg via INTRAVENOUS
  Filled 2020-10-04: qty 2

## 2020-10-04 MED ORDER — CEFDINIR 300 MG PO CAPS: 300.0000 mg | ORAL_CAPSULE | Freq: Two times a day (BID) | ORAL | 0 refills | Status: DC

## 2020-10-04 MED ORDER — ONDANSETRON 4 MG PO TBDP: 4.0000 mg | ORAL_TABLET | Freq: Three times a day (TID) | ORAL | 0 refills | Status: DC | PRN

## 2020-10-04 NOTE — ED Provider Notes (Signed)
[facility] Emergency Department Provider Note  ____________________________________________   Event Date/Time   First MD Initiated Contact with Patient 10/04/20 1429     (approximate)  I have reviewed the triage vital signs and the nursing notes.   HISTORY  Chief Complaint Urinary Tract Infection    HPI Kristi [name] is a 35 y.o. female presents emergency department stating that she was diagnosed with a UTI on 09/30/2020.  States she did not get an antibiotic until yesterday.  Has had symptoms since 1 week ago Friday.  The culture came back as positive for E. coli.  States that the on-call doctor called and Macrobid.  She has had fever, chills, flank pain, and nausea but no vomiting.    Past Medical History:  Diagnosis Date  . Acid reflux   . Anemia    with pregnanacies   . Anxiety   . Family history of adverse reaction to anesthesia   . Hypothyroidism   . Obesity   . Pap smear, low-risk 2011    Patient Active Problem List   Diagnosis Date Noted  . Anxiety 04/21/2020  . History of 2019 novel coronavirus disease (COVID-19) 05/24/2019  . Vitamin D deficiency disease 09/03/2014  . Hypothyroidism 01/22/2011    Past Surgical History:  Procedure Laterality Date  . DILATION AND CURETTAGE OF UTERUS  2015  . MEDIAL PATELLOFEMORAL LIGAMENT REPAIR Left 07/21/2020   Procedure: Left medial patella femoral ligament reconstruction using semitendinosus allograft, lateral retinacular release and lengthening with preceding knee arthroscopy and chondroplasty - Dedra Skeens to Assist;  Surgeon: Signa Kell, MD;  Location: [facility] ORS;  Service: Orthopedics;  Laterality: Left;    Prior to Admission medications   Medication Sig Start Date End Date Taking? Authorizing Provider  cefdinir (OMNICEF) 300 MG capsule Take 1 capsule (300 mg total) by mouth 2 (two) times daily. 10/04/20  Yes Meko Masterson, Roselyn Bering, PA-C  ondansetron (ZOFRAN-ODT) 4 MG disintegrating tablet Take  1 tablet (4 mg total) by mouth every 8 (eight) hours as needed. 10/04/20  Yes Ardis Fullwood, Roselyn Bering, PA-C  acetaminophen (TYLENOL) 500 MG tablet Take 2 tablets (1,000 mg total) by mouth every 8 (eight) hours. 07/21/20 07/21/21  Signa Kell, MD  HYDROcodone-acetaminophen (NORCO) 5-325 MG tablet Take 1-2 tablets by mouth every 4 (four) hours as needed for moderate pain or severe pain. 07/21/20   Signa Kell, MD  levothyroxine (SYNTHROID) 150 MCG tablet Take 150 mcg by mouth daily. 02/08/20   [provider]  pantoprazole (PROTONIX) 40 MG tablet Take 40 mg by mouth daily. 02/05/20   [provider]  sertraline (ZOLOFT) 100 MG tablet Take 1.5 tablets (150 mg total) by mouth daily. 04/21/20   Doreene Burke, CNM    Allergies Patient has no known allergies.  Family History  Problem Relation Age of Onset  . Hypothyroidism Mother   . Hypothyroidism Maternal Grandmother   . Heart disease Paternal Grandfather   . Hypothyroidism Father     Social History Social History   Tobacco Use  . Smoking status: Never Smoker  . Smokeless tobacco: Never Used  Vaping Use  . Vaping Use: Never used  Substance Use Topics  . Alcohol use: No  . Drug use: No    Review of Systems  Constitutional: No fever/chills Eyes: No visual changes. ENT: No sore throat. Respiratory: Denies cough Cardiovascular: Denies chest pain Gastrointestinal: Positive abdominal pain Genitourinary: Positive for dysuria. Musculoskeletal: Negative for back pain. Skin: Negative for rash. Psychiatric: no mood changes,  ____________________________________________   PHYSICAL EXAM:  VITAL SIGNS: ED Triage Vitals  Enc Vitals Group     BP 10/04/20 1330 (!) 139/96     Pulse Rate 10/04/20 1330 94     Resp 10/04/20 1330 16     Temp 10/04/20 1330 100.2 F (37.9 C)     Temp Source 10/04/20 1330 Oral     SpO2 10/04/20 1330 97 %     Weight 10/04/20 1331 230 lb (104.3 kg)     Height 10/04/20 1331 5\' 10"  (1.778 m)      Head Circumference --      Peak Flow --      Pain Score 10/04/20 1331 5     Pain Loc --      Pain Edu? --      Excl. in GC? --     Constitutional: Alert and oriented. Well appearing and in no acute distress. Eyes: Conjunctivae are normal.  Head: Atraumatic. Nose: No congestion/rhinnorhea. Mouth/Throat: Mucous membranes are moist.   Neck:  supple no lymphadenopathy noted Cardiovascular: Normal rate, regular rhythm. Heart sounds are normal Respiratory: Normal respiratory effort.  No retractions, lungs c t a  Abd: soft nontender bs normal all 4 quad, no CVA tenderness GU: deferred Musculoskeletal: FROM all extremities, warm and well perfused Neurologic:  Normal speech and language.  Skin:  Skin is warm, dry and intact. No rash noted. Psychiatric: Mood and affect are normal. Speech and behavior are normal.  ____________________________________________   LABS (all labs ordered are listed, but only abnormal results are displayed)  Labs Reviewed  CBC WITH DIFFERENTIAL/PLATELET - Abnormal; Notable for the following components:      Result Value   WBC 10.8 (*)    Neutro Abs 8.3 (*)    Monocytes Absolute 1.2 (*)    All other components within normal limits  URINALYSIS, COMPLETE (UACMP) WITH MICROSCOPIC - Abnormal; Notable for the following components:   Color, Urine YELLOW (*)    APPearance CLEAR (*)    Hgb urine dipstick SMALL (*)    Leukocytes,Ua TRACE (*)    All other components within normal limits  LACTIC ACID, PLASMA  LACTIC ACID, PLASMA  COMPREHENSIVE METABOLIC PANEL  PREGNANCY, URINE   ____________________________________________   ____________________________________________  RADIOLOGY  CT renal stone  ____________________________________________   PROCEDURES  Procedure(s) performed: No  Procedures    ____________________________________________   INITIAL IMPRESSION / ASSESSMENT AND PLAN / ED COURSE  Pertinent labs & imaging results that were  available during my care of the patient were reviewed by me and considered in my medical decision making (see chart for details).   Patient is 35 year old female presents with UTI/pyelonephritis symptoms.  See HPI.  Physical exam shows patient appears stable  DDx: UTI, pyelonephritis, infected kidney stone, kidney stone, sepsis  CBC has slight bump in the WBCs of 10.8, urinalysis has trace of leuks, comprehensive metabolic panel is normal, lactic acid is normal, pregnancy test is pending  CT renal stone  Patient was given saline 1 L IV, Rocephin 1 g IV, Zofran 4 mg IV   CT renal stone study does not show a kidney stone, shows phlebolith per radiologist.  Concerns for UTI.  Multiple ovarian cyst on the left. This was reviewed by me.  Explained findings to the patient.  I will switch her antibiotic to [facility].  She was given Rocephin here in the ED.  Zofran ODT for nausea.  Return emergency department worsening.  Follow-up with your regular doctor if not improving  in 2 to 3 days.  She was discharged in stable condition.  Kristi [name] was evaluated in Emergency Department on 10/04/2020 for the symptoms described in the history of present illness. She was evaluated in the context of the global COVID-19 pandemic, which necessitated consideration that the patient might be at risk for infection with the SARS-CoV-2 virus that causes COVID-19. Institutional protocols and algorithms that pertain to the evaluation of patients at risk for COVID-19 are in a state of rapid change based on information released by regulatory bodies including the CDC and federal and state organizations. These policies and algorithms were followed during the patient's care in the ED.    As part of my medical decision making, I reviewed the following data within the electronic MEDICAL RECORD NUMBER Nursing notes reviewed and incorporated, Labs reviewed , Old chart reviewed, Radiograph reviewed , Notes from prior ED visits and [place]  Controlled Substance Database  ____________________________________________   FINAL CLINICAL IMPRESSION(S) / ED DIAGNOSES  Final diagnoses:  Acute UTI      NEW MEDICATIONS STARTED DURING THIS VISIT:  New Prescriptions   CEFDINIR (OMNICEF) 300 MG CAPSULE    Take 1 capsule (300 mg total) by mouth 2 (two) times daily.   ONDANSETRON (ZOFRAN-ODT) 4 MG DISINTEGRATING TABLET    Take 1 tablet (4 mg total) by mouth every 8 (eight) hours as needed.     Note:  This document was prepared using Dragon voice recognition software and may include unintentional dictation errors.    Faythe Ghee, PA-C 10/04/20 1536    Jene Every, MD 10/05/20 [phone]

## 2020-10-04 NOTE — ED Triage Notes (Addendum)
Patient reports being diagnosed with UTI 09/30/2020. Has had symptoms since last Friday. Given antibiotic Friday after culture came back. Patient has had two doses of antibiotics. Reports lower back pain and generalized body pain. Low grade fever at home and in triage

## 2020-10-28 ENCOUNTER — Encounter: Payer: Self-pay | Admitting: Internal Medicine

## 2020-10-29 ENCOUNTER — Ambulatory Visit: Payer: BC Managed Care – PPO | Admitting: Anesthesiology

## 2020-10-29 ENCOUNTER — Ambulatory Visit
Admission: RE | Admit: 2020-10-29 | Discharge: 2020-10-29 | Disposition: A | Discharge status: Home / Self Care | Payer: BC Managed Care – PPO | Attending: Internal Medicine | Admitting: Internal Medicine

## 2020-10-29 ENCOUNTER — Encounter
Admission: RE | Disposition: A | Discharge status: Home / Self Care | Payer: Self-pay | Source: Home / Self Care | Attending: Internal Medicine

## 2020-10-29 ENCOUNTER — Encounter: Payer: Self-pay | Admitting: Internal Medicine

## 2020-10-29 DIAGNOSIS — Z791 Long term (current) use of non-steroidal anti-inflammatories (NSAID): Secondary | ICD-10-CM | POA: Diagnosis not present

## 2020-10-29 DIAGNOSIS — E039 Hypothyroidism, unspecified: Secondary | ICD-10-CM | POA: Diagnosis not present

## 2020-10-29 DIAGNOSIS — K224 Dyskinesia of esophagus: Secondary | ICD-10-CM | POA: Insufficient documentation

## 2020-10-29 DIAGNOSIS — K3189 Other diseases of stomach and duodenum: Secondary | ICD-10-CM | POA: Diagnosis not present

## 2020-10-29 DIAGNOSIS — K2289 Other specified disease of esophagus: Secondary | ICD-10-CM | POA: Insufficient documentation

## 2020-10-29 DIAGNOSIS — K2100 Gastro-esophageal reflux disease with esophagitis, without bleeding: Secondary | ICD-10-CM | POA: Diagnosis not present

## 2020-10-29 DIAGNOSIS — Z79890 Hormone replacement therapy (postmenopausal): Secondary | ICD-10-CM | POA: Diagnosis not present

## 2020-10-29 DIAGNOSIS — R1314 Dysphagia, pharyngoesophageal phase: Secondary | ICD-10-CM | POA: Diagnosis present

## 2020-10-29 DIAGNOSIS — Z79899 Other long term (current) drug therapy: Secondary | ICD-10-CM | POA: Insufficient documentation

## 2020-10-29 DIAGNOSIS — K2950 Unspecified chronic gastritis without bleeding: Secondary | ICD-10-CM | POA: Insufficient documentation

## 2020-10-29 HISTORY — DX: Vitamin D deficiency, unspecified: E55.9

## 2020-10-29 HISTORY — DX: Hyperlipidemia, unspecified: E78.5

## 2020-10-29 HISTORY — PX: ESOPHAGOGASTRODUODENOSCOPY (EGD) WITH PROPOFOL: SHX5813

## 2020-10-29 LAB — POCT PREGNANCY, URINE: Preg Test, Ur: NEGATIVE

## 2020-10-29 SURGERY — ESOPHAGOGASTRODUODENOSCOPY (EGD) WITH PROPOFOL
Anesthesia: General

## 2020-10-29 MED ORDER — LIDOCAINE HCL (PF) 2 % IJ SOLN
INTRAMUSCULAR | Status: AC
  Filled 2020-10-29: qty 2

## 2020-10-29 MED ORDER — SODIUM CHLORIDE 0.9 % IV SOLN
INTRAVENOUS | Status: DC
  Administered 2020-10-29: 1000 mL via INTRAVENOUS

## 2020-10-29 MED ORDER — PROPOFOL 500 MG/50ML IV EMUL
INTRAVENOUS | Status: AC
  Filled 2020-10-29: qty 50

## 2020-10-29 MED ORDER — PROPOFOL 500 MG/50ML IV EMUL
INTRAVENOUS | Status: DC | PRN
  Administered 2020-10-29: 150 ug/kg/min via INTRAVENOUS

## 2020-10-29 NOTE — H&P (Signed)
Outpatient short stay form Pre-procedure 10/29/2020 10:24 AM Kristi [name] K. Norma Fredrickson, M.D.  Primary Physician: Daniel Nones III, M.D.  Reason for visit:  GERD , Dyshagia, epigastric pain.  History of present illness:  35 y/o female presents with 2 year progressive worsening solid food dysphagia to the level of the suprasternal notch. She has long hx of GERD with moderate control with PPI. Denies hemetemesis, anorexia, weight loss., vomiting.    Current Facility-Administered Medications:    0.9 %  sodium chloride infusion, , Intravenous, Continuous, Doralee Kocak, Boykin Nearing, MD, Last Rate: 20 mL/hr at 10/29/20 1021, 1,000 mL at 10/29/20 1021  Medications Prior to Admission  Medication Sig Dispense Refill Last Dose   acetaminophen (TYLENOL) 500 MG tablet Take 2 tablets (1,000 mg total) by mouth every 8 (eight) hours. 90 tablet 2 Past Week   cholecalciferol (VITAMIN D3) 10 MCG (400 UNIT) TABS tablet Take 400 Units by mouth.   Past Week   HYDROcodone-acetaminophen (NORCO) 5-325 MG tablet Take 1-2 tablets by mouth every 4 (four) hours as needed for moderate pain or severe pain. 20 tablet 0 Past Week   ibuprofen (ADVIL) 800 MG tablet Take 800 mg by mouth every 8 (eight) hours as needed.   Past Week   levothyroxine (SYNTHROID) 150 MCG tablet Take 150 mcg by mouth daily.   10/28/2020   ondansetron (ZOFRAN-ODT) 4 MG disintegrating tablet Take 1 tablet (4 mg total) by mouth every 8 (eight) hours as needed. 20 tablet 0 Past Week   pantoprazole (PROTONIX) 40 MG tablet Take 40 mg by mouth daily.   Past Week   sertraline (ZOLOFT) 100 MG tablet Take 1.5 tablets (150 mg total) by mouth daily. 45 tablet 1 Past Week   cefdinir (OMNICEF) 300 MG capsule Take 1 capsule (300 mg total) by mouth 2 (two) times daily. (Patient not taking: Reported on 10/29/2020) 20 capsule 0 Completed Course     No Known Allergies   Past Medical History:  Diagnosis Date   Acid reflux    Anemia    with pregnanacies    Anxiety    Family  history of adverse reaction to anesthesia    Hyperlipidemia    Hypothyroidism    Obesity    Pap smear, low-risk 2011   Vitamin D deficiency disease     Review of systems:  Otherwise negative.    Physical Exam  Gen: Alert, oriented. Appears stated age.  HEENT: [place]/AT. PERRLA. Lungs: CTA, no wheezes. CV: RR nl S1, S2. Abd: soft, benign, no masses. BS+ Ext: No edema. Pulses 2+    Planned procedures: Proceed with EGD. The patient understands the nature of the planned procedure, indications, risks, alternatives and potential complications including but not limited to bleeding, infection, perforation, damage to internal organs and possible oversedation/side effects from anesthesia. The patient agrees and gives consent to proceed.  Please refer to procedure notes for findings, recommendations and patient disposition/instructions.     Edvin Albus K. Norma Fredrickson, M.D. Gastroenterology 10/29/2020  10:24 AM

## 2020-10-29 NOTE — Anesthesia Preprocedure Evaluation (Signed)
Anesthesia Evaluation  Patient identified by MRN, date of birth, ID band Patient awake    Reviewed: Allergy & Precautions, NPO status , Patient's Chart, lab work & pertinent test results  History of Anesthesia Complications Negative for: history of anesthetic complications  Airway Mallampati: II  TM Distance: >3 FB Neck ROM: Full    Dental no notable dental hx.    Pulmonary neg pulmonary ROS, neg sleep apnea, neg COPD,    breath sounds clear to auscultation- rhonchi (-) wheezing      Cardiovascular Exercise Tolerance: Good (-) hypertension(-) CAD, (-) Past MI, (-) Cardiac Stents and (-) CABG  Rhythm:Regular Rate:Normal - Systolic murmurs and - Diastolic murmurs    Neuro/Psych neg Seizures Anxiety negative neurological ROS     GI/Hepatic Neg liver ROS, GERD  ,  Endo/Other  neg diabetesHypothyroidism   Renal/GU negative Renal ROS     Musculoskeletal negative musculoskeletal ROS (+)   Abdominal (+) + obese,   Peds  Hematology  (+) anemia ,   Anesthesia Other Findings Past Medical History: No date: Acid reflux No date: Anemia     Comment:  with pregnanacies  No date: Anxiety No date: Family history of adverse reaction to anesthesia No date: Hyperlipidemia No date: Hypothyroidism No date: Obesity 2011: Pap smear, low-risk No date: Vitamin D deficiency disease   Reproductive/Obstetrics                             Anesthesia Physical Anesthesia Plan  ASA: 2  Anesthesia Plan: General   Post-op Pain Management:    Induction: Intravenous  PONV Risk Score and Plan: 2 and Propofol infusion  Airway Management Planned: Natural Airway  Additional Equipment:   Intra-op Plan:   Post-operative Plan:   Informed Consent: I have reviewed the patients History and Physical, chart, labs and discussed the procedure including the risks, benefits and alternatives for the proposed anesthesia  with the patient or authorized representative who has indicated his/her understanding and acceptance.     Dental advisory given  Plan Discussed with: CRNA and Anesthesiologist  Anesthesia Plan Comments:         Anesthesia Quick Evaluation

## 2020-10-29 NOTE — Anesthesia Postprocedure Evaluation (Signed)
Anesthesia Post Note  Patient: Kristi [name]  Procedure(s) Performed: ESOPHAGOGASTRODUODENOSCOPY (EGD) WITH PROPOFOL  Patient location during evaluation: Endoscopy Anesthesia Type: General Level of consciousness: awake and alert and oriented Pain management: pain level controlled Vital Signs Assessment: post-procedure vital signs reviewed and stable Respiratory status: spontaneous breathing, nonlabored ventilation and respiratory function stable Cardiovascular status: blood pressure returned to baseline and stable Postop Assessment: no signs of nausea or vomiting Anesthetic complications: no   No notable events documented.   Last Vitals:  Vitals:   10/29/20 1110 10/29/20 1113  BP:  111/81  Pulse: 68 65  Resp: 17 16  Temp:    SpO2: 100% 100%    Last Pain:  Vitals:   10/29/20 1054  TempSrc: Temporal  PainSc:                  [name]

## 2020-10-29 NOTE — Transfer of Care (Signed)
Immediate Anesthesia Transfer of Care Note  Patient: Kristi [name]  Procedure(s) Performed: ESOPHAGOGASTRODUODENOSCOPY (EGD) WITH PROPOFOL  Patient Location: PACU  Anesthesia Type:General  Level of Consciousness: awake and sedated  Airway & Oxygen Therapy: Patient Spontanous Breathing and Patient connected to nasal cannula oxygen  Post-op Assessment: Report given to RN and Post -op Vital signs reviewed and stable  Post vital signs: Reviewed and stable  Last Vitals:  Vitals Value Taken Time  BP    Temp    Pulse    Resp    SpO2      Last Pain:  Vitals:   10/29/20 1010  TempSrc: Temporal  PainSc: 0-No pain         Complications: No notable events documented.

## 2020-10-29 NOTE — Interval H&P Note (Signed)
History and Physical Interval Note:  10/29/2020 10:26 AM  Kristi [name]  has presented today for surgery, with the diagnosis of ESOPH DYSPHAGIA GERD.  The various methods of treatment have been discussed with the patient and family. After consideration of risks, benefits and other options for treatment, the patient has consented to  Procedure(s): ESOPHAGOGASTRODUODENOSCOPY (EGD) WITH PROPOFOL (N/A) as a surgical intervention.  The patient's history has been reviewed, patient examined, no change in status, stable for surgery.  I have reviewed the patient's chart and labs.  Questions were answered to the patient's satisfaction.     [place], [place]

## 2020-10-29 NOTE — Op Note (Addendum)
[facility] Gastroenterology Patient Name: Kristi [name] Procedure Date: 10/29/2020 10:27 AM MRN: [id number] Account #: [id number] Date of Birth: 03/24/1986 Admit Type: Outpatient Age: 35 Room: [facility] ENDO ROOM 2 Gender: Female Note Status: Finalized Procedure:             Upper GI endoscopy Indications:           Epigastric abdominal pain, Esophageal dysphagia,                         Gastro-esophageal reflux disease Providers:             Boykin Nearing. Norma Fredrickson MD, MD Referring MD:          Daniel Nones, MD (Referring MD) Medicines:             Propofol per Anesthesia Complications:         No immediate complications. Procedure:             Pre-Anesthesia Assessment:                        - The risks and benefits of the procedure and the                         sedation options and risks were discussed with the                         patient. All questions were answered and informed                         consent was obtained.                        - Patient identification and proposed procedure were                         verified prior to the procedure by the nurse. The                         procedure was verified in the procedure room.                        - ASA Grade Assessment: III - A patient with severe                         systemic disease.                        - After reviewing the risks and benefits, the patient                         was deemed in satisfactory condition to undergo the                         procedure.                        After obtaining informed consent, the endoscope was                         passed under direct vision. Throughout the procedure,  the patient's blood pressure, pulse, and oxygen                         saturations were monitored continuously. The Endoscope                         was introduced through the mouth, and advanced to the                         third part of duodenum.  The upper GI endoscopy was                         accomplished without difficulty. The patient tolerated                         the procedure well. Findings:      Localized mild mucosal variance characterized by altered texture was       found in the distal esophagus. Biopsies were taken with a cold forceps       for histology.      The Z-line was irregular and was found at the gastroesophageal junction.       Mucosa was biopsied with a cold forceps for histology. One specimen       bottle was sent to pathology.      Patchy mildly erythematous mucosa without bleeding was found in the       gastric antrum. Biopsies were taken with a cold forceps for histology.      The cardia and gastric fundus were normal on retroflexion.      The examined duodenum was normal.      Abnormal motility was noted at the gastroesophageal junction. The       cricopharyngeus was abnormal. The distal esophagus/lower esophageal       sphincter is spastic, but gives up passage to the endoscope. The scope       was withdrawn. Dilation was performed with a Maloney dilator with       moderate resistance at 54 Fr.      The exam was otherwise without abnormality. Impression:            - Esophageal mucosal variant. Biopsied.                        - Z-line irregular, at the gastroesophageal junction.                         Biopsied.                        - Erythematous mucosa in the antrum. Biopsied.                        - Normal examined duodenum.                        - Abnormal esophageal motility. Dilated.                        - The examination was otherwise normal. Recommendation:        - Patient has a contact number available for  emergencies. The signs and symptoms of potential                         delayed complications were discussed with the patient.                         Return to normal activities tomorrow. Written                         discharge instructions were  provided to the patient.                        - Resume previous diet.                        - Continue present medications.                        - Await pathology results.                        - Monitor results to esophageal dilation                        - Return to physician assistant in 2 months.                        - Follow up with Jacob Moores, PA-C in [ ]  months.                        - The findings and recommendations were discussed with                         the patient. Procedure Code(s):     --- Professional ---                        [phone], Esophagogastroduodenoscopy, flexible,                         transoral; with biopsy, single or multiple                        [id number], Dilation of esophagus, by unguided sound or                         bougie, single or multiple passes Diagnosis Code(s):     --- Professional ---                        K21.9, Gastro-esophageal reflux disease without                         esophagitis                        R13.14, Dysphagia, pharyngoesophageal phase                        R10.13, Epigastric pain                        K22.4, Dyskinesia of esophagus  K31.89, Other diseases of stomach and duodenum                        K22.8, Other specified diseases of esophagus CPT copyright 2019 American Medical Association. All rights reserved. The codes documented in this report are preliminary and upon coder review may  be revised to meet current compliance requirements. Stanton Kidney MD, MD 10/29/2020 10:51:42 AM This report has been signed electronically. Number of Addenda: 0 Note Initiated On: 10/29/2020 10:27 AM Estimated Blood Loss:  Estimated blood loss: none. Estimated blood loss:                         none. Estimated blood loss: none.      [facility]

## 2020-10-29 NOTE — Anesthesia Procedure Notes (Signed)
Date/Time: 10/29/2020 10:47 AM Performed by: Tonia Ghent Pre-anesthesia Checklist: Patient identified, Emergency Drugs available, Suction available, Patient being monitored and Timeout performed Patient Re-evaluated:Patient Re-evaluated prior to induction Oxygen Delivery Method: Nasal cannula Preoxygenation: Pre-oxygenation with 100% oxygen Induction Type: IV induction Airway Equipment and Method: Bite block Placement Confirmation: positive ETCO2 and CO2 detector

## 2020-10-30 ENCOUNTER — Encounter: Payer: Self-pay | Admitting: Internal Medicine

## 2020-10-30 LAB — SURGICAL PATHOLOGY

## 2021-08-20 ENCOUNTER — Encounter: Payer: Self-pay | Admitting: Emergency Medicine

## 2021-08-20 ENCOUNTER — Emergency Department: Payer: BC Managed Care – PPO

## 2021-08-20 ENCOUNTER — Emergency Department
Admission: EM | Admit: 2021-08-20 | Discharge: 2021-08-20 | Disposition: A | Discharge status: Home / Self Care | Payer: BC Managed Care – PPO | Attending: Emergency Medicine | Admitting: Emergency Medicine

## 2021-08-20 ENCOUNTER — Other Ambulatory Visit: Payer: Self-pay

## 2021-08-20 DIAGNOSIS — Z8616 Personal history of COVID-19: Secondary | ICD-10-CM | POA: Insufficient documentation

## 2021-08-20 DIAGNOSIS — K8020 Calculus of gallbladder without cholecystitis without obstruction: Secondary | ICD-10-CM | POA: Insufficient documentation

## 2021-08-20 DIAGNOSIS — R6883 Chills (without fever): Secondary | ICD-10-CM | POA: Diagnosis not present

## 2021-08-20 DIAGNOSIS — R8289 Other abnormal findings on cytological and histological examination of urine: Secondary | ICD-10-CM | POA: Diagnosis not present

## 2021-08-20 DIAGNOSIS — E039 Hypothyroidism, unspecified: Secondary | ICD-10-CM | POA: Insufficient documentation

## 2021-08-20 DIAGNOSIS — R1013 Epigastric pain: Secondary | ICD-10-CM | POA: Diagnosis present

## 2021-08-20 DIAGNOSIS — N390 Urinary tract infection, site not specified: Secondary | ICD-10-CM | POA: Diagnosis not present

## 2021-08-20 LAB — URINALYSIS, ROUTINE W REFLEX MICROSCOPIC
Bilirubin Urine: NEGATIVE
Glucose, UA: NEGATIVE mg/dL
Hgb urine dipstick: NEGATIVE
Ketones, ur: NEGATIVE mg/dL
Nitrite: NEGATIVE
Protein, ur: NEGATIVE mg/dL
Specific Gravity, Urine: 1.021 (ref 1.005–1.030)
pH: 6 (ref 5.0–8.0)

## 2021-08-20 LAB — CBC
HCT: 38.8 % (ref 36.0–46.0)
Hemoglobin: 12.3 g/dL (ref 12.0–15.0)
MCH: 27.1 pg (ref 26.0–34.0)
MCHC: 31.7 g/dL (ref 30.0–36.0)
MCV: 85.5 fL (ref 80.0–100.0)
Platelets: 277 10*3/uL (ref 150–400)
RBC: 4.54 MIL/uL (ref 3.87–5.11)
RDW: 13.5 % (ref 11.5–15.5)
WBC: 9.4 10*3/uL (ref 4.0–10.5)
nRBC: 0 % (ref 0.0–0.2)

## 2021-08-20 LAB — COMPREHENSIVE METABOLIC PANEL
ALT: 18 U/L (ref 0–44)
AST: 19 U/L (ref 15–41)
Albumin: 4.2 g/dL (ref 3.5–5.0)
Alkaline Phosphatase: 50 U/L (ref 38–126)
Anion gap: 7 (ref 5–15)
BUN: 12 mg/dL (ref 6–20)
CO2: 26 mmol/L (ref 22–32)
Calcium: 9 mg/dL (ref 8.9–10.3)
Chloride: 105 mmol/L (ref 98–111)
Creatinine, Ser: 0.64 mg/dL (ref 0.44–1.00)
GFR, Estimated: >60 mL/min (ref >60)
Glucose, Bld: 123 mg/dL — ABNORMAL HIGH (ref 70–99)
Potassium: 3.7 mmol/L (ref 3.5–5.1)
Sodium: 138 mmol/L (ref 135–145)
Total Bilirubin: 0.7 mg/dL (ref 0.3–1.2)
Total Protein: 7.8 g/dL (ref 6.5–8.1)

## 2021-08-20 LAB — LIPASE, BLOOD: Lipase: 31 U/L (ref 11–51)

## 2021-08-20 LAB — POC URINE PREG, ED: Preg Test, Ur: NEGATIVE

## 2021-08-20 LAB — LACTIC ACID, PLASMA: Lactic Acid, Venous: 1.1 mmol/L (ref 0.5–1.9)

## 2021-08-20 MED ORDER — OXYCODONE-ACETAMINOPHEN 5-325 MG PO TABS: 1.0000 | ORAL_TABLET | ORAL | 0 refills | Status: DC | PRN

## 2021-08-20 MED ORDER — ONDANSETRON HCL 4 MG/2ML IJ SOLN
4.0000 mg | Freq: Once | INTRAMUSCULAR | Status: AC
  Administered 2021-08-20: 4 mg via INTRAVENOUS
  Filled 2021-08-20: qty 2

## 2021-08-20 MED ORDER — FENTANYL CITRATE PF 50 MCG/ML IJ SOSY
50.0000 ug | PREFILLED_SYRINGE | Freq: Once | INTRAMUSCULAR | Status: AC
  Administered 2021-08-20: 50 ug via INTRAVENOUS
  Filled 2021-08-20: qty 1

## 2021-08-20 MED ORDER — ONDANSETRON 4 MG PO TBDP: 4.0000 mg | ORAL_TABLET | Freq: Three times a day (TID) | ORAL | 0 refills | Status: DC | PRN

## 2021-08-20 MED ORDER — CEPHALEXIN 500 MG PO CAPS: 500.0000 mg | ORAL_CAPSULE | Freq: Three times a day (TID) | ORAL | 0 refills | Status: AC

## 2021-08-20 MED ORDER — SODIUM CHLORIDE 0.9 % IV BOLUS
1000.0000 mL | Freq: Once | INTRAVENOUS | Status: AC
  Administered 2021-08-20: 1000 mL via INTRAVENOUS

## 2021-08-20 MED ORDER — FAMOTIDINE 20 MG PO TABS
20.0000 mg | ORAL_TABLET | Freq: Once | ORAL | Status: AC
  Administered 2021-08-20: 20 mg via ORAL
  Filled 2021-08-20: qty 1

## 2021-08-20 MED ORDER — PIPERACILLIN-TAZOBACTAM 3.375 G IVPB 30 MIN
3.3750 g | Freq: Once | INTRAVENOUS | Status: AC
  Administered 2021-08-20: 3.375 g via INTRAVENOUS
  Filled 2021-08-20: qty 50

## 2021-08-20 NOTE — ED Provider Notes (Signed)
? ?[facility] ?Provider Note ? ? ? Event Date/Time  ? First MD Initiated Contact with Patient 08/20/21 0415   ?  (approximate) ? ? ?History  ? ?Abdominal Pain ? ? ?HPI ? ?Kristi [name] is a 36 y.o. female who presents to the ED from home with a chief complaint of abdominal pain, nausea/vomiting x1 and chills.  Reports awakening at 1 AM with epigastric abdominal pain radiating to left flank.  Vomited 1 time.  Endorses chills.  Denies fever, cough, chest pain, shortness of breath, dysuria or diarrhea.  Reports prior issues with gallbladder.  Denies history of kidney stones ?  ? ? ?Past Medical History  ? ?Past Medical History:  ?Diagnosis Date  ?? Acid reflux   ?? Anemia   ? with pregnanacies   ?? Anxiety   ?? Family history of adverse reaction to anesthesia   ?? Hyperlipidemia   ?? Hypothyroidism   ?? Obesity   ?? Pap smear, low-risk 2011  ?? Vitamin D deficiency disease   ? ? ? ?Active Problem List  ? ?Patient Active Problem List  ? Diagnosis Date Noted  ?? Anxiety 04/21/2020  ?? History of 2019 novel coronavirus disease (COVID-19) 05/24/2019  ?? Vitamin D deficiency disease 09/03/2014  ?? Hypothyroidism 01/22/2011  ? ? ? ?Past Surgical History  ? ?Past Surgical History:  ?Procedure Laterality Date  ?? DILATION AND CURETTAGE OF UTERUS  2015  ?? ESOPHAGOGASTRODUODENOSCOPY (EGD) WITH PROPOFOL N/A 10/29/2020  ? Procedure: ESOPHAGOGASTRODUODENOSCOPY (EGD) WITH PROPOFOL;  Surgeon: Toledo, Boykin Nearing, MD;  Location: [facility] ENDOSCOPY;  Service: Gastroenterology;  Laterality: N/A;  ?? MEDIAL PATELLOFEMORAL LIGAMENT REPAIR Left 07/21/2020  ? Procedure: Left medial patella femoral ligament reconstruction using semitendinosus allograft, lateral retinacular release and lengthening with preceding knee arthroscopy and chondroplasty - Dedra Skeens to Assist;  Surgeon: Signa Kell, MD;  Location: [facility] ORS;  Service: Orthopedics;  Laterality: Left;  ? ? ? ?Home Medications  ? ?Prior to Admission medications    ?Medication Sig Start Date End Date Taking? Authorizing Provider  ?cefdinir (OMNICEF) 300 MG capsule Take 1 capsule (300 mg total) by mouth 2 (two) times daily. ?Patient not taking: Reported on 10/29/2020 10/04/20   Faythe Ghee, PA-C  ?cholecalciferol (VITAMIN D3) 10 MCG (400 UNIT) TABS tablet Take 400 Units by mouth.    [provider]  ?HYDROcodone-acetaminophen (NORCO) 5-325 MG tablet Take 1-2 tablets by mouth every 4 (four) hours as needed for moderate pain or severe pain. 07/21/20   Signa Kell, MD  ?ibuprofen (ADVIL) 800 MG tablet Take 800 mg by mouth every 8 (eight) hours as needed.    [provider]  ?levothyroxine (SYNTHROID) 150 MCG tablet Take 150 mcg by mouth daily. 02/08/20   [provider]  ?ondansetron (ZOFRAN-ODT) 4 MG disintegrating tablet Take 1 tablet (4 mg total) by mouth every 8 (eight) hours as needed. 10/04/20   Sherrie Mustache Roselyn Bering, PA-C  ?pantoprazole (PROTONIX) 40 MG tablet Take 40 mg by mouth daily. 02/05/20   [provider]  ?sertraline (ZOLOFT) 100 MG tablet Take 1.5 tablets (150 mg total) by mouth daily. 04/21/20   Doreene Burke, CNM  ? ? ? ?Allergies  ?Patient has no known allergies. ? ? ?Family History  ? ?Family History  ?Problem Relation Age of Onset  ?? Hypothyroidism Mother   ?? Hypothyroidism Maternal Grandmother   ?? Heart disease Paternal Grandfather   ?? Hypothyroidism Father   ? ? ? ?Physical Exam  ?Triage Vital Signs: ?ED Triage  Vitals  ?Enc Vitals Group  ?   BP 08/20/21 0025 (!) 130/92  ?   Pulse Rate 08/20/21 0025 89  ?   Resp 08/20/21 0025 16  ?   Temp 08/20/21 0025 97.9 ?F (36.6 ?C)  ?   Temp Source 08/20/21 0025 Oral  ?   SpO2 08/20/21 0025 98 %  ?   Weight 08/20/21 0025 230 lb (104.3 kg)  ?   Height 08/20/21 0025 5\' 10"  (1.778 m)  ?   Head Circumference --   ?   Peak Flow --   ?   Pain Score 08/20/21 0032 5  ?   Pain Loc --   ?   Pain Edu? --   ?   Excl. in GC? --   ? ? ?Updated Vital Signs: ?BP 102/69   Pulse 75   Temp 97.9 ?F  (36.6 ?C) (Oral)   Resp 15   Ht 5\' 10"  (1.778 m)   Wt 104.3 kg   LMP 08/02/2021 (Exact Date)   SpO2 93%   BMI 33.00 kg/m?  ? ? ?General: Awake, mild distress.  ?CV:  RRR.  Good peripheral perfusion.  ?Resp:  Normal effort.  CTA B. ?Abd:  Mild tenderness to palpation epigastric region without rebound or guarding.  No CVAT.  No distention.  ?Other:  No vesicles. ? ? ?ED Results / Procedures / Treatments  ?Labs ?(all labs ordered are listed, but only abnormal results are displayed) ?Labs Reviewed  ?COMPREHENSIVE METABOLIC PANEL - Abnormal; Notable for the following components:  ?    Result Value  ? Glucose, Bld 123 (*)   ? All other components within normal limits  ?URINALYSIS, ROUTINE W REFLEX MICROSCOPIC - Abnormal; Notable for the following components:  ? Color, Urine YELLOW (*)   ? APPearance CLOUDY (*)   ? Leukocytes,Ua MODERATE (*)   ? Bacteria, UA RARE (*)   ? All other components within normal limits  ?CULTURE, BLOOD (ROUTINE X 2)  ?CULTURE, BLOOD (ROUTINE X 2)  ?URINE CULTURE  ?LIPASE, BLOOD  ?CBC  ?LACTIC ACID, PLASMA  ?POC URINE PREG, ED  ? ? ? ?EKG ? ?ED ECG REPORT ?I, Irean HongSUNG,Cecil Bixby J, the attending physician, personally viewed and interpreted this ECG. ? ? Date: 08/20/2021 ? EKG Time: 0031 ? Rate: 96 ? Rhythm: normal sinus rhythm ? Axis: Normal ? Intervals:none ? ST&T Change: Nonspecific ? ? ? ?RADIOLOGY ?I have independently visualized and interpreted patient's ultrasound as well as noted the radiology interpretation: ? ?Ultrasound: Cholelithiasis without cholecystitis ? ?Official radiology report(s): ?US ABDOMEN LIMITED RUQ (LIVER/GB) ? ?Result Date: 08/20/2021 ?CLINICAL DATA:  36 year old female with history of epigastric pain. EXAM: ULTRASOUND ABDOMEN LIMITED RIGHT UPPER QUADRANT COMPARISON:  No prior abdominal ultrasound. CT the abdomen and pelvis 10/04/2020. FINDINGS: Gallbladder: Multiple echogenic foci are noted within the gallbladder with posterior acoustic shadowing, compatible with gallstones,  measuring up to 1.5 cm. Gallbladder is not distended. Gallbladder wall thickness is normal (2.2 mm). No pericholecystic fluid. Per report from the sonographer, there was no sonographic Murphy's sign on examination. Common bile duct: Diameter: 3.5 mm Liver: No focal lesion identified. Within normal limits in parenchymal echogenicity. Portal vein is patent on color Doppler imaging with normal direction of blood flow towards the liver. Other: None. IMPRESSION: 1. Study is positive for cholelithiasis, but there is no evidence to suggest acute cholecystitis at this time. Electronically Signed   By: Trudie Reedaniel  Entrikin M.D.   On: 08/20/2021 05:23   ? ? ?PROCEDURES: ? ?Critical  Care performed: No ? ?.1-3 Lead EKG Interpretation ?Performed by: Irean Hong, MD ?Authorized by: Irean Hong, MD  ? ?  Interpretation: normal   ?  ECG rate:  95 ?  ECG rate assessment: normal   ?  Rhythm: sinus rhythm   ?  Ectopy: none   ?  Conduction: normal   ?Comments:  ?   Patient placed on cardiac monitor to evaluate for arrhythmias ? ? ?MEDICATIONS ORDERED IN ED: ?Medications  ?piperacillin-tazobactam (ZOSYN) IVPB 3.375 g (3.375 g Intravenous New Bag/Given 08/20/21 0555)  ?sodium chloride 0.9 % bolus 1,000 mL (1,000 mLs Intravenous New Bag/Given 08/20/21 0543)  ?ondansetron [facility]) injection 4 mg (4 mg Intravenous Given 08/20/21 0535)  ?fentaNYL (SUBLIMAZE) injection 50 mcg (50 mcg Intravenous Given 08/20/21 0546)  ?famotidine (PEPCID) tablet 20 mg (20 mg Oral Given 08/20/21 0544)  ? ? ? ?IMPRESSION / MDM / ASSESSMENT AND PLAN / ED COURSE  ?I reviewed the triage vital signs and the nursing notes. ?             ?               ?36 year old female presenting with abdominal pain, nausea/vomiting and chills. Differential diagnosis includes, but is not limited to, biliary disease (biliary colic, acute cholecystitis, cholangitis, choledocholithiasis, etc), intrathoracic causes for epigastric abdominal pain including ACS, gastritis, duodenitis,  pancreatitis, small bowel or large bowel obstruction, abdominal aortic aneurysm, hernia, and ulcer(s).  I have personally reviewed patient's records and see a PCP office visit from 05/08/2021 for follow-up obesity. ? ?The

## 2021-08-20 NOTE — ED Triage Notes (Signed)
Pt to ED from home c/o abd pain since last night. Nausea vomiting x1, chills.  Epigastric pain, dull pain, feels bloated, radiating under left breast.  Denies urinary changes, pt A&Ox4, chest rise even and unlabored, skin WNL and in NAD at this time. ?

## 2021-08-20 NOTE — Discharge Instructions (Signed)
1. Take medicines as needed for pain & nausea (Percocet/Zofran #20). ?2. Clear liquids x 12 hours, then bland diet x 1 week, then slowly advance diet as tolerated. Avoid fatty, greasy, spicy foods and drinks. ?3.  Take antibiotic as prescribed (Keflex 500mg  3 times daily x7 days). ?4.  Return to the ER for worsening symptoms, persistent vomiting, fever, difficulty breathing or other concerns.  ?

## 2021-08-21 LAB — URINE CULTURE: Culture: >=100000 — AB

## 2021-08-25 ENCOUNTER — Ambulatory Visit: Payer: BC Managed Care – PPO | Admitting: Surgery

## 2021-08-25 ENCOUNTER — Encounter: Payer: Self-pay | Admitting: Surgery

## 2021-08-25 ENCOUNTER — Ambulatory Visit: Payer: Self-pay | Admitting: Surgery

## 2021-08-25 VITALS — BP 114/82 | HR 70 | Temp 98.0°F | Ht 70.0 in | Wt 244.0 lb

## 2021-08-25 DIAGNOSIS — K8010 Calculus of gallbladder with chronic cholecystitis without obstruction: Secondary | ICD-10-CM | POA: Diagnosis not present

## 2021-08-25 LAB — CULTURE, BLOOD (ROUTINE X 2)
Culture: NO GROWTH
Special Requests: ADEQUATE

## 2021-08-25 NOTE — Patient Instructions (Signed)
Our surgery scheduler Britta Mccreedy will call you within 24-48 hours to get you scheduled. If you have not heard from her after 48 hours, please call our office. Have the blue sheet available when she calls to write down important information. ? ?If you have any concerns or questions, please feel free to call our office. ? ?Minimally Invasive Cholecystectomy ?Minimally invasive cholecystectomy is surgery to remove the gallbladder. The gallbladder is a pear-shaped organ that lies beneath the liver on the right side of the body. The gallbladder stores bile, which is a fluid that helps the body digest fats. Cholecystectomy is often done to treat inflammation (irritation and swelling) of the gallbladder (cholecystitis). This condition is usually caused by a buildup of gallstones (cholelithiasis) in the gallbladder or when the fluid in the gall bladder becomes stagnant because gallstones get stuck in the ducts (tubes) and block the flow of bile. This can result in inflammation and pain. In severe cases, emergency surgery may be required. ?This procedure is done through small incisions in the abdomen, instead of one large incision. It is also called laparoscopic surgery. A thin scope with a camera (laparoscope) is inserted through one incision. Then surgical instruments are inserted through the other incisions. In some cases, a minimally invasive surgery may need to be changed to a surgery that is done through a larger incision. This is called open surgery. ?Tell a health care provider about: ?Any allergies you have. ?All medicines you are taking, including vitamins, herbs, eye drops, creams, and over-the-counter medicines. ?Any problems you or family members have had with anesthetic medicines. ?Any bleeding problems you have. ?Any surgeries you have had. ?Any medical conditions you have. ?Whether you are pregnant or may be pregnant. ?What are the risks? ?Generally, this is a safe procedure. However, problems may occur,  including: ?Infection. ?Bleeding. ?Allergic reactions to medicines. ?Damage to nearby structures or organs. ?A gallstone remaining in the common bile duct. The common bile duct carries bile from the gallbladder to the small intestine. ?A bile leak from the liver or cystic duct after your gallbladder is removed. ?What happens before the procedure? ?When to stop eating and drinking ?Follow instructions from your health care provider about what you may eat and drink before your procedure. These may include: ?8 hours before the procedure ?Stop eating most foods. Do not eat meat, fried foods, or fatty foods. ?Eat only light foods, such as toast or crackers. ?All liquids are okay except energy drinks and alcohol. ?6 hours before the procedure ?Stop eating. ?Drink only clear liquids, such as water, clear fruit juice, black coffee, plain tea, and sports drinks. ?Do not drink energy drinks or alcohol. ?2 hours before the procedure ?Stop drinking all liquids. ?You may be allowed to take medicines with small sips of water. ?If you do not follow your health care provider's instructions, your procedure may be delayed or canceled. ?Medicines ?Ask your health care provider about: ?Changing or stopping your regular medicines. This is especially important if you are taking diabetes medicines or blood thinners. ?Taking medicines such as aspirin and ibuprofen. These medicines can thin your blood. Do not take these medicines unless your health care provider tells you to take them. ?Taking over-the-counter medicines, vitamins, herbs, and supplements. ?General instructions ?If you will be going home right after the procedure, plan to have a responsible adult: ?Take you home from the hospital or clinic. You will not be allowed to drive. ?Care for you for the time you are told. ?Do not use  any products that contain nicotine or tobacco for at least 4 weeks before the procedure. These products include cigarettes, chewing tobacco, and vaping  devices, such as e-cigarettes. If you need help quitting, ask your health care provider. ?Ask your health care provider: ?How your surgery site will be marked. ?What steps will be taken to help prevent infection. These may include: ?Removing hair at the surgery site. ?Washing skin with a germ-killing soap. ?Taking antibiotic medicine. ?What happens during the procedure? ? ?An IV will be inserted into one of your veins. ?You will be given one or both of the following: ?A medicine to help you relax (sedative). ?A medicine to make you fall asleep (general anesthetic). ?Your surgeon will make several small incisions in your abdomen. ?The laparoscope will be inserted through one of the small incisions. The camera on the laparoscope will send images to a monitor in the operating room. This lets your surgeon see inside your abdomen. ?A gas will be pumped into your abdomen. This will expand your abdomen to give the surgeon more room to perform the surgery. ?Other tools that are needed for the procedure will be inserted through the other incisions. The gallbladder will be removed through one of the incisions. ?Your common bile duct may be examined. If stones are found in the common bile duct, they may be removed. ?After your gallbladder has been removed, the incisions will be closed with stitches (sutures), staples, or skin glue. ?Your incisions will be covered with a bandage (dressing). ?The procedure may vary among health care providers and hospitals. ?What happens after the procedure? ?Your blood pressure, heart rate, breathing rate, and blood oxygen level will be monitored until you leave the hospital or clinic. ?You will be given medicines as needed to control your pain. ?You may have a drain placed in the incision. The drain will be removed a day or two after the procedure. ?Summary ?Minimally invasive cholecystectomy, also called laparoscopic cholecystectomy, is surgery to remove the gallbladder using small  incisions. ?Tell your health care provider about all the medical conditions you have and all the medicines you are taking for those conditions. ?Before the procedure, follow instructions about when to stop eating and drinking and changing or stopping medicines. ?Plan to have a responsible adult care for you for the time you are told after you leave the hospital or clinic. ?This information is not intended to replace advice given to you by your health care provider. Make sure you discuss any questions you have with your health care provider. ?Document Revised: 10/28/2020 Document Reviewed: 10/28/2020 ?Elsevier Patient Education ? 2023 [employer] ? ? ?Gallbladder Eating Plan ? ?High blood cholesterol, obesity, a sedentary lifestyle, an unhealthy diet, and diabetes are risk factors for developing gallstones. ?If you have a gallbladder condition, you may have trouble digesting fats and tolerating high fat intake. Eating a low-fat diet can help reduce your symptoms and may be helpful before and after having surgery to remove your gallbladder (cholecystectomy). Your health care provider may recommend that you work with a dietitian to help you reduce the amount of fat in your diet. ?What are tips for following this plan? ?General guidelines ?Limit your fat intake to less than 30% of your total daily calories. If you eat around 1,800 calories each day, this means eating less than 60 grams (g) of fat per day. ?Fat is an important part of a healthy diet. Eating a low-fat diet can make it hard to maintain a healthy body weight. Ask  your dietitian how much fat, calories, and other nutrients you need each day. ?Eat small, frequent meals throughout the day instead of three large meals. ?Drink at least 8-10 cups (1.9-2.4 L) of fluid a day. Drink enough fluid to keep your urine pale yellow. ?If you drink alcohol: ?Limit how much you have to: ?0-1 drink a day for women who are not pregnant. ?0-2 drinks a day for men. ?Know how  much alcohol is in a drink. In the [place] one drink equals one 12 oz bottle of beer (355 mL), one 5 oz glass of wine (148 mL), or one 1? oz glass of hard liquor (44 mL). ?Reading food labels ? ?Check nutrition facts on f

## 2021-08-25 NOTE — Progress Notes (Signed)
Patient ID: Kristi [name], female   DOB: 1986/04/08, 36 y.o.   MRN: [id number] ? ?Chief Complaint: Epigastric pain ? ?History of Present Illness ?TIFFNEY [name] is a 36 y.o. female with long history of intermittent postprandial epigastric pain, over multiple years.  She reports the pain radiates around the left side to the back, exacerbated by eating.  She reports she has been avoiding fatty/fried foods but is concurrently sustaining herself with peanut butter and jelly.  She reports nausea and vomiting on the most severe attack which was when she presented to the ED not long ago.  She denies any history of hepatitis or jaundice.  Has known gallstones.  No prior other abdominal surgeries.. ? ?Past Medical History ?Past Medical History:  ?Diagnosis Date  ? Acid reflux   ? Anemia   ? with pregnanacies   ? Anxiety   ? Family history of adverse reaction to anesthesia   ? Hyperlipidemia   ? Hypothyroidism   ? Obesity   ? Pap smear, low-risk 2011  ? Vitamin D deficiency disease   ?  ? ? ?Past Surgical History:  ?Procedure Laterality Date  ? DILATION AND CURETTAGE OF UTERUS  2015  ? ESOPHAGOGASTRODUODENOSCOPY (EGD) WITH PROPOFOL N/A 10/29/2020  ? Procedure: ESOPHAGOGASTRODUODENOSCOPY (EGD) WITH PROPOFOL;  Surgeon: Toledo, Boykin Nearing, MD;  Location: [facility];  Service: Gastroenterology;  Laterality: N/A;  ? MEDIAL PATELLOFEMORAL LIGAMENT REPAIR Left 07/21/2020  ? Procedure: Left medial patella femoral ligament reconstruction using semitendinosus allograft, lateral retinacular release and lengthening with preceding knee arthroscopy and chondroplasty - Dedra Skeens to Assist;  Surgeon: Signa Kell, MD;  Location: [facility] ORS;  Service: Orthopedics;  Laterality: Left;  ? ? ?No Known Allergies ? ?Current Outpatient Medications  ?Medication Sig Dispense Refill  ? cephALEXin (KEFLEX) 500 MG capsule Take 1 capsule (500 mg total) by mouth 3 (three) times daily. 21 capsule 0  ? cholecalciferol (VITAMIN D3) 10 MCG (400 UNIT)  TABS tablet Take 400 Units by mouth.    ? ibuprofen (ADVIL) 800 MG tablet Take 800 mg by mouth every 8 (eight) hours as needed.    ? levothyroxine (SYNTHROID) 150 MCG tablet Take 150 mcg by mouth daily.    ? oxyCODONE-acetaminophen (PERCOCET/ROXICET) 5-325 MG tablet Take 1 tablet by mouth every 4 (four) hours as needed for severe pain. 15 tablet 0  ? pantoprazole (PROTONIX) 40 MG tablet Take 40 mg by mouth daily.    ? ?No current facility-administered medications for this visit.  ? ? ?Family History ?Family History  ?Problem Relation Age of Onset  ? Hypothyroidism Mother   ? Hypothyroidism Maternal Grandmother   ? Heart disease Paternal Grandfather   ? Hypothyroidism Father   ?  ? ? ?Social History ?Social History  ? ?Tobacco Use  ? Smoking status: Never  ? Smokeless tobacco: Never  ?Vaping Use  ? Vaping Use: Never used  ?Substance Use Topics  ? Alcohol use: No  ? Drug use: No  ?  ?  ? ? ?Review of Systems  ?Constitutional:  Positive for chills and fever.  ?HENT: Negative.    ?Eyes: Negative.   ?Respiratory: Negative.    ?Cardiovascular: Negative.   ?Gastrointestinal:  Positive for abdominal pain, heartburn, nausea and vomiting.  ?Genitourinary: Negative.   ?Skin: Negative.   ?Neurological: Negative.   ?Psychiatric/Behavioral: Negative.    ?  ? ?Physical Exam ?Blood pressure 114/82, pulse 70, temperature 98 ?F (36.7 ?C), height 5\' 10"  (1.778 m), weight 244 lb (110.7 kg), last menstrual  period 08/02/2021, SpO2 99 %. ?Last Weight  Most recent update: 08/25/2021 11:27 AM  ? ? Weight  ?110.7 kg (244 lb)  ?      ? ?  ? ? ?CONSTITUTIONAL: Well developed, and nourished, appropriately responsive and aware without distress.   ?EYES: Sclera non-icteric.   ?EARS, NOSE, MOUTH AND THROAT:  The oropharynx is clear. Oral mucosa is pink and moist.  Hearing is intact to voice.  ?NECK: Trachea is midline, and there is no jugular venous distension.  ?LYMPH NODES:  Lymph nodes in the neck are not enlarged. ?RESPIRATORY:  Lungs are  clear, and breath sounds are equal bilaterally. Normal respiratory effort without pathologic use of accessory muscles. ?CARDIOVASCULAR: Heart is regular in rate and rhythm. ?GI: The abdomen is soft, nontender, and nondistended. There were no palpable masses. I did not appreciate hepatosplenomegaly. There were normal bowel sounds. ?MUSCULOSKELETAL:  Symmetrical muscle tone appreciated in all four extremities.    ?SKIN: Skin turgor is normal. No pathologic skin lesions appreciated.  ?NEUROLOGIC:  Motor and sensation appear grossly normal.  Cranial nerves are grossly without defect. ?PSYCH:  Alert and oriented to person, place and time. Affect is appropriate for situation. ? ?Data Reviewed ?I have personally reviewed what is currently available of the patient's imaging, recent labs and medical records.   ?Labs:  ? ?  Latest Ref Rng & Units 08/20/2021  ? 12:26 AM 10/04/2020  ?  1:38 PM 09/05/2019  ?  1:46 PM  ?CBC  ?WBC 4.0 - 10.5 K/uL 9.4   10.8   6.8    ?Hemoglobin 12.0 - 15.0 g/dL 33.8   25.0   53.9    ?Hematocrit 36.0 - 46.0 % 38.8   37.3   33.8    ?Platelets 150 - 400 K/uL 277   263   396    ? ? ?  Latest Ref Rng & Units 08/20/2021  ? 12:26 AM 10/04/2020  ?  1:38 PM 07/19/2019  ?  8:38 PM  ?CMP  ?Glucose 70 - 99 mg/dL 767   97   341    ?BUN 6 - 20 mg/dL 12   10   6     ?Creatinine 0.44 - 1.00 mg/dL   9.37   9.02    ?Sodium 135 - 145 mmol/L 138   137   136    ?Potassium 3.5 - 5.1 mmol/L 3.7   4.6   3.2    ?Chloride 98 - 111 mmol/L 105   104   105    ?CO2 22 - 32 mmol/L 26   26   21     ?Calcium 8.9 - 10.3 mg/dL 9.0   9.2   9.0    ?Total Protein 6.5 - 8.1 g/dL 7.8   7.9   6.3    ?Total Bilirubin 0.3 - 1.2 mg/dL 0.7   0.4   0.3    ?Alkaline Phos 38 - 126 U/L 50   65   63    ?AST 15 - 41 U/L 19   23   17     ?ALT 0 - 44 U/L 18   30   14     ? ? ? ?Imaging: ?Radiology review:  ?CLINICAL DATA:  36 year old female with history of epigastric pain. ?  ?EXAM: ?ULTRASOUND ABDOMEN LIMITED RIGHT UPPER QUADRANT ?  ?COMPARISON:  No  prior abdominal ultrasound. CT the abdomen and ?pelvis 10/04/2020. ?  ?FINDINGS: ?Gallbladder: ?  ?Multiple echogenic foci are noted within the gallbladder with ?  posterior acoustic shadowing, compatible with gallstones, measuring ?up to 1.5 cm. Gallbladder is not distended. Gallbladder wall ?thickness is normal (2.2 mm). No pericholecystic fluid. Per report ?from the sonographer, there was no sonographic Murphy's sign on ?examination. ?  ?Common bile duct: ?  ?Diameter: 3.5 mm ?  ?Liver: ?  ?No focal lesion identified. Within normal limits in parenchymal ?echogenicity. Portal vein is patent on color Doppler imaging with ?normal direction of blood flow towards the liver. ?  ?Other: None. ?  ?IMPRESSION: ?1. Study is positive for cholelithiasis, but there is no evidence to ?suggest acute cholecystitis at this time. ?  ?  ?Electronically Signed ?  By: Daniel  Entrikin M.D. ?  On: 08/20/2021 05:23 ?Within last 24 hrs: No results found. ? ?Assessment ?   ?Chronic calculus cholecystitis with biliary colic ? ?Patient Active Problem List  ? Diagnosis Date Noted  ? Anxiety 04/21/2020  ? History of 2019 novel coronavirus disease (COVID-19) 05/24/2019  ? Vitamin D deficiency disease 09/03/2014  ? Hypothyroidism 01/22/2011  ? ? ?Plan ?   ?Robotic cholecystectomy with ICG imaging. ? ?This patient was seen and examined, and I concur with the H&P associated with this note.  This was discussed thoroughly.  Optimal plan is for robotic cholecystectomy.  Risks and benefits have been discussed with the patient which include but are not limited to anesthesia, bleeding, infection, biliary ductal injury or stenosis, other associated unanticipated injuries affiliated with laparoscopic surgery.  I believe there is the desire to proceed, interpreter utilized as needed.  Questions elicited and answered to satisfaction.  No guarantees ever expressed or implied. ? ? ?Face-to-face time spent with the patient and accompanying care providers(if  present) was 30 minutes, with more than 50% of the time spent counseling, educating, and coordinating care of the patient.   ? ?These notes generated with voice recognition software. I apologize for typographica

## 2021-08-25 NOTE — H&P (View-Only) (Signed)
Patient ID: Kristi [name], female   DOB: 1986/04/08, 36 y.o.   MRN: [id number] ? ?Chief Complaint: Epigastric pain ? ?History of Present Illness ?Kristi [name] is a 36 y.o. female with long history of intermittent postprandial epigastric pain, over multiple years.  She reports the pain radiates around the left side to the back, exacerbated by eating.  She reports she has been avoiding fatty/fried foods but is concurrently sustaining herself with peanut butter and jelly.  She reports nausea and vomiting on the most severe attack which was when she presented to the ED not long ago.  She denies any history of hepatitis or jaundice.  Has known gallstones.  No prior other abdominal surgeries.. ? ?Past Medical History ?Past Medical History:  ?Diagnosis Date  ? Acid reflux   ? Anemia   ? with pregnanacies   ? Anxiety   ? Family history of adverse reaction to anesthesia   ? Hyperlipidemia   ? Hypothyroidism   ? Obesity   ? Pap smear, low-risk 2011  ? Vitamin D deficiency disease   ?  ? ? ?Past Surgical History:  ?Procedure Laterality Date  ? DILATION AND CURETTAGE OF UTERUS  2015  ? ESOPHAGOGASTRODUODENOSCOPY (EGD) WITH PROPOFOL N/A 10/29/2020  ? Procedure: ESOPHAGOGASTRODUODENOSCOPY (EGD) WITH PROPOFOL;  Surgeon: Toledo, Boykin Nearing, MD;  Location: [facility];  Service: Gastroenterology;  Laterality: N/A;  ? MEDIAL PATELLOFEMORAL LIGAMENT REPAIR Left 07/21/2020  ? Procedure: Left medial patella femoral ligament reconstruction using semitendinosus allograft, lateral retinacular release and lengthening with preceding knee arthroscopy and chondroplasty - Dedra Skeens to Assist;  Surgeon: Signa Kell, MD;  Location: [facility] ORS;  Service: Orthopedics;  Laterality: Left;  ? ? ?No Known Allergies ? ?Current Outpatient Medications  ?Medication Sig Dispense Refill  ? cephALEXin (KEFLEX) 500 MG capsule Take 1 capsule (500 mg total) by mouth 3 (three) times daily. 21 capsule 0  ? cholecalciferol (VITAMIN D3) 10 MCG (400 UNIT)  TABS tablet Take 400 Units by mouth.    ? ibuprofen (ADVIL) 800 MG tablet Take 800 mg by mouth every 8 (eight) hours as needed.    ? levothyroxine (SYNTHROID) 150 MCG tablet Take 150 mcg by mouth daily.    ? oxyCODONE-acetaminophen (PERCOCET/ROXICET) 5-325 MG tablet Take 1 tablet by mouth every 4 (four) hours as needed for severe pain. 15 tablet 0  ? pantoprazole (PROTONIX) 40 MG tablet Take 40 mg by mouth daily.    ? ?No current facility-administered medications for this visit.  ? ? ?Family History ?Family History  ?Problem Relation Age of Onset  ? Hypothyroidism Mother   ? Hypothyroidism Maternal Grandmother   ? Heart disease Paternal Grandfather   ? Hypothyroidism Father   ?  ? ? ?Social History ?Social History  ? ?Tobacco Use  ? Smoking status: Never  ? Smokeless tobacco: Never  ?Vaping Use  ? Vaping Use: Never used  ?Substance Use Topics  ? Alcohol use: No  ? Drug use: No  ?  ?  ? ? ?Review of Systems  ?Constitutional:  Positive for chills and fever.  ?HENT: Negative.    ?Eyes: Negative.   ?Respiratory: Negative.    ?Cardiovascular: Negative.   ?Gastrointestinal:  Positive for abdominal pain, heartburn, nausea and vomiting.  ?Genitourinary: Negative.   ?Skin: Negative.   ?Neurological: Negative.   ?Psychiatric/Behavioral: Negative.    ?  ? ?Physical Exam ?Blood pressure 114/82, pulse 70, temperature 98 ?F (36.7 ?C), height 5\' 10"  (1.778 m), weight 244 lb (110.7 kg), last menstrual  period 08/02/2021, SpO2 99 %. ?Last Weight  Most recent update: 08/25/2021 11:27 AM  ? ? Weight  ?110.7 kg (244 lb)  ?      ? ?  ? ? ?CONSTITUTIONAL: Well developed, and nourished, appropriately responsive and aware without distress.   ?EYES: Sclera non-icteric.   ?EARS, NOSE, MOUTH AND THROAT:  The oropharynx is clear. Oral mucosa is pink and moist.  Hearing is intact to voice.  ?NECK: Trachea is midline, and there is no jugular venous distension.  ?LYMPH NODES:  Lymph nodes in the neck are not enlarged. ?RESPIRATORY:  Lungs are  clear, and breath sounds are equal bilaterally. Normal respiratory effort without pathologic use of accessory muscles. ?CARDIOVASCULAR: Heart is regular in rate and rhythm. ?GI: The abdomen is soft, nontender, and nondistended. There were no palpable masses. I did not appreciate hepatosplenomegaly. There were normal bowel sounds. ?MUSCULOSKELETAL:  Symmetrical muscle tone appreciated in all four extremities.    ?SKIN: Skin turgor is normal. No pathologic skin lesions appreciated.  ?NEUROLOGIC:  Motor and sensation appear grossly normal.  Cranial nerves are grossly without defect. ?PSYCH:  Alert and oriented to person, place and time. Affect is appropriate for situation. ? ?Data Reviewed ?I have personally reviewed what is currently available of the patient's imaging, recent labs and medical records.   ?Labs:  ? ?  Latest Ref Rng & Units 08/20/2021  ? 12:26 AM 10/04/2020  ?  1:38 PM 09/05/2019  ?  1:46 PM  ?CBC  ?WBC 4.0 - 10.5 K/uL 9.4   10.8   6.8    ?Hemoglobin 12.0 - 15.0 g/dL 33.8   25.0   53.9    ?Hematocrit 36.0 - 46.0 % 38.8   37.3   33.8    ?Platelets 150 - 400 K/uL 277   263   396    ? ? ?  Latest Ref Rng & Units 08/20/2021  ? 12:26 AM 10/04/2020  ?  1:38 PM 07/19/2019  ?  8:38 PM  ?CMP  ?Glucose 70 - 99 mg/dL 767   97   341    ?BUN 6 - 20 mg/dL 12   10   6     ?Creatinine 0.44 - 1.00 mg/dL   9.37   9.02    ?Sodium 135 - 145 mmol/L 138   137   136    ?Potassium 3.5 - 5.1 mmol/L 3.7   4.6   3.2    ?Chloride 98 - 111 mmol/L 105   104   105    ?CO2 22 - 32 mmol/L 26   26   21     ?Calcium 8.9 - 10.3 mg/dL 9.0   9.2   9.0    ?Total Protein 6.5 - 8.1 g/dL 7.8   7.9   6.3    ?Total Bilirubin 0.3 - 1.2 mg/dL 0.7   0.4   0.3    ?Alkaline Phos 38 - 126 U/L 50   65   63    ?AST 15 - 41 U/L 19   23   17     ?ALT 0 - 44 U/L 18   30   14     ? ? ? ?Imaging: ?Radiology review:  ?CLINICAL DATA:  36 year old female with history of epigastric pain. ?  ?EXAM: ?ULTRASOUND ABDOMEN LIMITED RIGHT UPPER QUADRANT ?  ?COMPARISON:  No  prior abdominal ultrasound. CT the abdomen and ?pelvis 10/04/2020. ?  ?FINDINGS: ?Gallbladder: ?  ?Multiple echogenic foci are noted within the gallbladder with ?  posterior acoustic shadowing, compatible with gallstones, measuring ?up to 1.5 cm. Gallbladder is not distended. Gallbladder wall ?thickness is normal (2.2 mm). No pericholecystic fluid. Per report ?from the sonographer, there was no sonographic Murphy's sign on ?examination. ?  ?Common bile duct: ?  ?Diameter: 3.5 mm ?  ?Liver: ?  ?No focal lesion identified. Within normal limits in parenchymal ?echogenicity. Portal vein is patent on color Doppler imaging with ?normal direction of blood flow towards the liver. ?  ?Other: None. ?  ?IMPRESSION: ?1. Study is positive for cholelithiasis, but there is no evidence to ?suggest acute cholecystitis at this time. ?  ?  ?Electronically Signed ?  By: Trudie Reedaniel  Entrikin M.D. ?  On: 08/20/2021 05:23 ?Within last 24 hrs: No results found. ? ?Assessment ?   ?Chronic calculus cholecystitis with biliary colic ? ?Patient Active Problem List  ? Diagnosis Date Noted  ? Anxiety 04/21/2020  ? History of 2019 novel coronavirus disease (COVID-19) 05/24/2019  ? Vitamin D deficiency disease 09/03/2014  ? Hypothyroidism 01/22/2011  ? ? ?Plan ?   ?Robotic cholecystectomy with ICG imaging. ? ?This patient was seen and examined, and I concur with the H&P associated with this note.  This was discussed thoroughly.  Optimal plan is for robotic cholecystectomy.  Risks and benefits have been discussed with the patient which include but are not limited to anesthesia, bleeding, infection, biliary ductal injury or stenosis, other associated unanticipated injuries affiliated with laparoscopic surgery.  I believe there is the desire to proceed, interpreter utilized as needed.  Questions elicited and answered to satisfaction.  No guarantees ever expressed or implied. ? ? ?Face-to-face time spent with the patient and accompanying care providers(if  present) was 30 minutes, with more than 50% of the time spent counseling, educating, and coordinating care of the patient.   ? ?These notes generated with voice recognition software. I apologize for typographica

## 2021-08-26 ENCOUNTER — Telehealth: Payer: Self-pay | Admitting: Surgery

## 2021-08-26 NOTE — Telephone Encounter (Signed)
Updated information regarding rescheduled surgery:   ? ?Patient has been advised of Pre-Admission date/time, COVID Testing date and Surgery date. ? ?Surgery Date: 09/16/21 ?Preadmission Testing Date: 08/28/21 (phone 1p-5p) ?Covid Testing Date: Not needed.    ? ?Patient has been made aware to call [phone], between 1-3:00pm the day before surgery, to find out what time to arrive for surgery.  ' ?

## 2021-08-28 ENCOUNTER — Inpatient Hospital Stay
Admission: RE | Admit: 2021-08-28 | Discharge: 2021-08-28 | Disposition: A | Discharge status: Home / Self Care | Payer: BC Managed Care – PPO | Source: Ambulatory Visit

## 2021-09-11 ENCOUNTER — Other Ambulatory Visit: Payer: Self-pay

## 2021-09-11 ENCOUNTER — Other Ambulatory Visit
Admission: RE | Admit: 2021-09-11 | Discharge: 2021-09-11 | Disposition: A | Discharge status: Home / Self Care | Payer: BC Managed Care – PPO | Source: Ambulatory Visit | Attending: Surgery | Admitting: Surgery

## 2021-09-11 HISTORY — DX: Headache, unspecified: R51.9

## 2021-09-11 NOTE — Patient Instructions (Addendum)
Your procedure is scheduled on: 09/16/21 - Wednesday ?Report to the Registration Desk on the 1st floor of the [facility]. ?To find out your arrival time, please call [phone] between 1PM - 3PM on: 09/15/21 - Tuesday ?If your arrival time is 6:00 am, do not arrive prior to that time as the [facility] entrance doors do not open until 6:00 am. ? ?REMEMBER: ?Instructions that are not followed completely may result in serious medical risk, up to and including death; or upon the discretion of your surgeon and anesthesiologist your surgery may need to be rescheduled. ? ?Do not eat food or drink any fluids after midnight the night before surgery.  ?No gum chewing, lozengers or hard candies. ? ?TAKE THESE MEDICATIONS THE MORNING OF SURGERY WITH A SIP OF WATER: ? ?- levothyroxine (SYNTHROID) 150 MCG tablet ?- pantoprazole (PROTONIX) 40 MG tablet, (take one the night before and one on the morning of surgery - helps to prevent nausea after surgery.) ? ?One week prior to surgery: ?Stop Anti-inflammatories (NSAIDS) such as Advil, Aleve, Ibuprofen, Motrin, Naproxen, Naprosyn and Aspirin based products such as Excedrin, Goodys Powder, BC Powder. ? ?Stop ANY OVER THE COUNTER supplements until after surgery. ? ?You may take Tylenol if needed for pain up until the day before your surgery. ? ?No Alcohol for 24 hours before or after surgery. ? ?No Smoking including e-cigarettes for 24 hours prior to surgery.  ?No chewable tobacco products for at least 6 hours prior to surgery.  ?No nicotine patches on the day of surgery. ? ?Do not use any "recreational" drugs for at least a week prior to your surgery.  ?Please be advised that the combination of cocaine and anesthesia may have negative outcomes, up to and including death. ?If you test positive for cocaine, your surgery will be cancelled. ? ?On the morning of surgery brush your teeth with toothpaste and water, you may rinse your mouth with mouthwash if you wish. ?Do not swallow  any toothpaste or mouthwash. ? ?Do not wear jewelry, make-up, hairpins, clips or nail polish. ? ?Do not wear lotions, powders, or perfumes.  ? ?Do not shave body from the neck down 48 hours prior to surgery just in case you cut yourself which could leave a site for infection.  ?Also, freshly shaved skin may become irritated if using the CHG soap. ? ?Contact lenses, hearing aids and dentures may not be worn into surgery. ? ?Do not bring valuables to the hospital. [facility] is not responsible for any missing/lost belongings or valuables.  ? ?Notify your doctor if there is any change in your medical condition (cold, fever, infection). ? ?Wear comfortable clothing (specific to your surgery type) to the hospital. ? ?After surgery, you can help prevent lung complications by doing breathing exercises.  ?Take deep breaths and cough every 1-2 hours. Your doctor may order a device called an Incentive Spirometer to help you take deep breaths. ?When coughing or sneezing, hold a pillow firmly against your incision with both hands. This is called ?splinting.? Doing this helps protect your incision. It also decreases belly discomfort. ? ?If you are being admitted to the hospital overnight, leave your suitcase in the car. ?After surgery it may be brought to your room. ? ?If you are being discharged the day of surgery, you will not be allowed to drive home. ?You will need a responsible adult (18 years or older) to drive you home and stay with you that night.  ? ?If you are taking  public transportation, you will need to have a responsible adult (18 years or older) with you. ?Please confirm with your physician that it is acceptable to use public transportation.  ? ?Please call the Pre-admissions Testing Dept. at [phone] if you have any questions about these instructions. ? ?Surgery Visitation Policy: ? ?Patients undergoing a surgery or procedure may have two family members or support persons with them as long as the person is  not COVID-19 positive or experiencing its symptoms.  ? ?Inpatient Visitation:   ? ?Visiting hours are 7 a.m. to 8 p.m. ?Up to four visitors are allowed at one time in a patient room, including children. The visitors may rotate out with other people during the day. One designated support person (adult) may remain overnight.  ?

## 2021-09-15 MED ORDER — CEFAZOLIN SODIUM-DEXTROSE 2-4 GM/100ML-% IV SOLN
2.0000 g | INTRAVENOUS | Status: AC
  Administered 2021-09-16: 2 g via INTRAVENOUS

## 2021-09-15 MED ORDER — CHLORHEXIDINE GLUCONATE 0.12 % MT SOLN: 15.0000 mL | Freq: Once | OROMUCOSAL | Status: AC

## 2021-09-15 MED ORDER — BUPIVACAINE LIPOSOME 1.3 % IJ SUSP: 20.0000 mL | Freq: Once | INTRAMUSCULAR | Status: DC

## 2021-09-15 MED ORDER — CHLORHEXIDINE GLUCONATE CLOTH 2 % EX PADS: 6.0000 | MEDICATED_PAD | Freq: Once | CUTANEOUS | Status: DC

## 2021-09-15 MED ORDER — INDOCYANINE GREEN 25 MG IV SOLR
1.2500 mg | Freq: Once | INTRAVENOUS | Status: AC
  Administered 2021-09-16: 1.25 mg via INTRAVENOUS
  Filled 2021-09-15: qty 0.5

## 2021-09-15 MED ORDER — CHLORHEXIDINE GLUCONATE CLOTH 2 % EX PADS
6.0000 | MEDICATED_PAD | Freq: Once | CUTANEOUS | Status: AC
  Administered 2021-09-16: 6 via TOPICAL

## 2021-09-15 MED ORDER — ORAL CARE MOUTH RINSE: 15.0000 mL | Freq: Once | OROMUCOSAL | Status: AC

## 2021-09-15 MED ORDER — CELECOXIB 200 MG PO CAPS: 200.0000 mg | ORAL_CAPSULE | ORAL | Status: AC

## 2021-09-15 MED ORDER — ACETAMINOPHEN 500 MG PO TABS: 1000.0000 mg | ORAL_TABLET | ORAL | Status: AC

## 2021-09-15 MED ORDER — GABAPENTIN 300 MG PO CAPS: 300.0000 mg | ORAL_CAPSULE | ORAL | Status: AC

## 2021-09-15 MED ORDER — LACTATED RINGERS IV SOLN: INTRAVENOUS | Status: DC

## 2021-09-16 ENCOUNTER — Ambulatory Visit: Payer: BC Managed Care – PPO | Admitting: Anesthesiology

## 2021-09-16 ENCOUNTER — Ambulatory Visit
Admission: RE | Admit: 2021-09-16 | Discharge: 2021-09-16 | Disposition: A | Discharge status: Home / Self Care | Payer: BC Managed Care – PPO | Attending: Surgery | Admitting: Surgery

## 2021-09-16 ENCOUNTER — Other Ambulatory Visit: Payer: Self-pay

## 2021-09-16 ENCOUNTER — Encounter
Admission: RE | Disposition: A | Discharge status: Home / Self Care | Payer: Self-pay | Source: Home / Self Care | Attending: Surgery

## 2021-09-16 ENCOUNTER — Encounter: Payer: Self-pay | Admitting: Surgery

## 2021-09-16 DIAGNOSIS — E669 Obesity, unspecified: Secondary | ICD-10-CM | POA: Insufficient documentation

## 2021-09-16 DIAGNOSIS — Z6834 Body mass index (BMI) 34.0-34.9, adult: Secondary | ICD-10-CM | POA: Diagnosis not present

## 2021-09-16 DIAGNOSIS — K219 Gastro-esophageal reflux disease without esophagitis: Secondary | ICD-10-CM | POA: Diagnosis not present

## 2021-09-16 DIAGNOSIS — Z79899 Other long term (current) drug therapy: Secondary | ICD-10-CM | POA: Diagnosis not present

## 2021-09-16 DIAGNOSIS — K8010 Calculus of gallbladder with chronic cholecystitis without obstruction: Secondary | ICD-10-CM | POA: Diagnosis present

## 2021-09-16 DIAGNOSIS — E039 Hypothyroidism, unspecified: Secondary | ICD-10-CM | POA: Insufficient documentation

## 2021-09-16 DIAGNOSIS — E559 Vitamin D deficiency, unspecified: Secondary | ICD-10-CM | POA: Insufficient documentation

## 2021-09-16 LAB — POCT PREGNANCY, URINE: Preg Test, Ur: NEGATIVE

## 2021-09-16 SURGERY — CHOLECYSTECTOMY, ROBOT-ASSISTED, LAPAROSCOPIC
Anesthesia: General | Site: Abdomen

## 2021-09-16 MED ORDER — FENTANYL CITRATE (PF) 100 MCG/2ML IJ SOLN
INTRAMUSCULAR | Status: AC
  Administered 2021-09-16: 25 ug via INTRAVENOUS
  Filled 2021-09-16: qty 2

## 2021-09-16 MED ORDER — DEXAMETHASONE SODIUM PHOSPHATE 10 MG/ML IJ SOLN
INTRAMUSCULAR | Status: DC | PRN
  Administered 2021-09-16: 10 mg via INTRAVENOUS

## 2021-09-16 MED ORDER — PROMETHAZINE HCL 25 MG/ML IJ SOLN: 6.2500 mg | INTRAMUSCULAR | Status: DC | PRN

## 2021-09-16 MED ORDER — CELECOXIB 200 MG PO CAPS
ORAL_CAPSULE | ORAL | Status: AC
  Administered 2021-09-16: 200 mg via ORAL
  Filled 2021-09-16: qty 1

## 2021-09-16 MED ORDER — DROPERIDOL 2.5 MG/ML IJ SOLN
INTRAMUSCULAR | Status: DC
Start: 2021-09-16 — End: 2021-09-16
  Filled 2021-09-16: qty 2

## 2021-09-16 MED ORDER — PROPOFOL 10 MG/ML IV BOLUS
INTRAVENOUS | Status: DC | PRN
  Administered 2021-09-16: 200 mg via INTRAVENOUS

## 2021-09-16 MED ORDER — CEFAZOLIN SODIUM-DEXTROSE 2-4 GM/100ML-% IV SOLN
INTRAVENOUS | Status: AC
  Filled 2021-09-16: qty 100

## 2021-09-16 MED ORDER — BUPIVACAINE-EPINEPHRINE (PF) 0.25% -1:200000 IJ SOLN
INTRAMUSCULAR | Status: AC
  Filled 2021-09-16: qty 30

## 2021-09-16 MED ORDER — FENTANYL CITRATE (PF) 100 MCG/2ML IJ SOLN
25.0000 ug | INTRAMUSCULAR | Status: DC | PRN
  Administered 2021-09-16: 50 ug via INTRAVENOUS
  Administered 2021-09-16: 25 ug via INTRAVENOUS

## 2021-09-16 MED ORDER — OXYCODONE HCL 5 MG PO TABS: 5.0000 mg | ORAL_TABLET | Freq: Four times a day (QID) | ORAL | 0 refills | Status: DC | PRN

## 2021-09-16 MED ORDER — OXYCODONE HCL 5 MG PO TABS: 5.0000 mg | ORAL_TABLET | Freq: Once | ORAL | Status: DC

## 2021-09-16 MED ORDER — MIDAZOLAM HCL 2 MG/2ML IJ SOLN
INTRAMUSCULAR | Status: AC
  Filled 2021-09-16: qty 2

## 2021-09-16 MED ORDER — SUGAMMADEX SODIUM 200 MG/2ML IV SOLN
INTRAVENOUS | Status: DC | PRN
  Administered 2021-09-16: 300 mg via INTRAVENOUS

## 2021-09-16 MED ORDER — LIDOCAINE HCL (CARDIAC) PF 100 MG/5ML IV SOSY
PREFILLED_SYRINGE | INTRAVENOUS | Status: DC | PRN
  Administered 2021-09-16: 100 mg via INTRAVENOUS

## 2021-09-16 MED ORDER — PHENYLEPHRINE HCL (PRESSORS) 10 MG/ML IV SOLN
INTRAVENOUS | Status: DC | PRN
  Administered 2021-09-16: 160 ug via INTRAVENOUS

## 2021-09-16 MED ORDER — OXYCODONE HCL 5 MG PO TABS: 5.0000 mg | ORAL_TABLET | Freq: Once | ORAL | Status: AC | PRN

## 2021-09-16 MED ORDER — 0.9 % SODIUM CHLORIDE (POUR BTL) OPTIME
TOPICAL | Status: DC | PRN
  Administered 2021-09-16: 1000 mL

## 2021-09-16 MED ORDER — ONDANSETRON HCL 4 MG/2ML IJ SOLN
INTRAMUSCULAR | Status: DC | PRN
  Administered 2021-09-16: 4 mg via INTRAVENOUS

## 2021-09-16 MED ORDER — LACTATED RINGERS IV SOLN: INTRAVENOUS | Status: DC | PRN

## 2021-09-16 MED ORDER — OXYCODONE HCL 5 MG/5ML PO SOLN: 5.0000 mg | Freq: Once | ORAL | Status: AC | PRN

## 2021-09-16 MED ORDER — ATROPINE SULFATE 0.4 MG/ML IV SOLN
INTRAVENOUS | Status: DC | PRN
  Administered 2021-09-16: .4 mg via INTRAVENOUS

## 2021-09-16 MED ORDER — DROPERIDOL 2.5 MG/ML IJ SOLN
0.6250 mg | Freq: Once | INTRAMUSCULAR | Status: AC | PRN
  Administered 2021-09-16: 0.625 mg via INTRAVENOUS

## 2021-09-16 MED ORDER — ACETAMINOPHEN 10 MG/ML IV SOLN: 1000.0000 mg | Freq: Once | INTRAVENOUS | Status: DC | PRN

## 2021-09-16 MED ORDER — GLYCOPYRROLATE 0.2 MG/ML IJ SOLN
INTRAMUSCULAR | Status: DC | PRN
  Administered 2021-09-16: .2 mg via INTRAVENOUS

## 2021-09-16 MED ORDER — CHLORHEXIDINE GLUCONATE 0.12 % MT SOLN
OROMUCOSAL | Status: AC
  Administered 2021-09-16: 15 mL via OROMUCOSAL
  Filled 2021-09-16: qty 15

## 2021-09-16 MED ORDER — MIDAZOLAM HCL 2 MG/2ML IJ SOLN
INTRAMUSCULAR | Status: DC | PRN
  Administered 2021-09-16: 2 mg via INTRAVENOUS

## 2021-09-16 MED ORDER — FENTANYL CITRATE (PF) 100 MCG/2ML IJ SOLN
INTRAMUSCULAR | Status: AC
  Filled 2021-09-16: qty 2

## 2021-09-16 MED ORDER — ROCURONIUM BROMIDE 100 MG/10ML IV SOLN
INTRAVENOUS | Status: DC | PRN
  Administered 2021-09-16: 60 mg via INTRAVENOUS
  Administered 2021-09-16: 15 mg via INTRAVENOUS

## 2021-09-16 MED ORDER — GABAPENTIN 300 MG PO CAPS
ORAL_CAPSULE | ORAL | Status: AC
  Administered 2021-09-16: 300 mg via ORAL
  Filled 2021-09-16: qty 1

## 2021-09-16 MED ORDER — OXYCODONE HCL 5 MG PO TABS
ORAL_TABLET | ORAL | Status: AC
  Administered 2021-09-16: 5 mg via ORAL
  Filled 2021-09-16: qty 1

## 2021-09-16 MED ORDER — ACETAMINOPHEN 500 MG PO TABS
ORAL_TABLET | ORAL | Status: AC
  Administered 2021-09-16: 1000 mg via ORAL
  Filled 2021-09-16: qty 2

## 2021-09-16 MED ORDER — FENTANYL CITRATE (PF) 100 MCG/2ML IJ SOLN
INTRAMUSCULAR | Status: DC | PRN
  Administered 2021-09-16: 100 ug via INTRAVENOUS

## 2021-09-16 MED ORDER — OXYCODONE HCL 5 MG PO TABS
ORAL_TABLET | ORAL | Status: AC
  Filled 2021-09-16: qty 1

## 2021-09-16 MED ORDER — BUPIVACAINE HCL 0.25 % IJ SOLN
INTRAMUSCULAR | Status: DC | PRN
  Administered 2021-09-16: 28 mL via INTRAMUSCULAR

## 2021-09-16 SURGICAL SUPPLY — 47 items
BAG RETRIEVAL 10 (BASKET) ×1
CLIP LIGATING HEM O LOK PURPLE (MISCELLANEOUS) ×2 IMPLANT
COVER TIP SHEARS 8 DVNC (MISCELLANEOUS) ×1 IMPLANT
COVER TIP SHEARS 8MM DA VINCI (MISCELLANEOUS) ×1
DERMABOND ADVANCED (GAUZE/BANDAGES/DRESSINGS) ×1
DERMABOND ADVANCED .7 DNX12 (GAUZE/BANDAGES/DRESSINGS) ×1 IMPLANT
DRAPE ARM DVNC X/XI (DISPOSABLE) ×4 IMPLANT
DRAPE COLUMN DVNC XI (DISPOSABLE) ×1 IMPLANT
DRAPE DA VINCI XI ARM (DISPOSABLE) ×4
DRAPE DA VINCI XI COLUMN (DISPOSABLE) ×1
ELECT CAUTERY BLADE 6.4 (BLADE) ×2 IMPLANT
GLOVE ORTHO TXT STRL SZ7.5 (GLOVE) ×6 IMPLANT
GOWN STRL REUS W/ TWL LRG LVL3 (GOWN DISPOSABLE) ×2 IMPLANT
GOWN STRL REUS W/ TWL XL LVL3 (GOWN DISPOSABLE) ×2 IMPLANT
GOWN STRL REUS W/TWL LRG LVL3 (GOWN DISPOSABLE) ×1
GOWN STRL REUS W/TWL XL LVL3 (GOWN DISPOSABLE) ×2
GRASPER SUT TROCAR 14GX15 (MISCELLANEOUS) IMPLANT
INFUSOR MANOMETER BAG 3000ML (MISCELLANEOUS) IMPLANT
IRRIGATION STRYKERFLOW (MISCELLANEOUS) IMPLANT
IRRIGATOR STRYKERFLOW (MISCELLANEOUS)
IRRIGATOR SUCT 8 DISP DVNC XI (IRRIGATION / IRRIGATOR) IMPLANT
IRRIGATOR SUCTION 8MM XI DISP (IRRIGATION / IRRIGATOR)
IV NS IRRIG 3000ML ARTHROMATIC (IV SOLUTION) IMPLANT
KIT PINK PAD W/HEAD ARE REST (MISCELLANEOUS) ×2
KIT PINK PAD W/HEAD ARM REST (MISCELLANEOUS) ×1 IMPLANT
KIT TURNOVER KIT A (KITS) ×2 IMPLANT
LABEL OR SOLS (LABEL) ×2 IMPLANT
MANIFOLD NEPTUNE II (INSTRUMENTS) ×2 IMPLANT
NDL INSUFFLATION 14GA 120MM (NEEDLE) IMPLANT
NEEDLE HYPO 22GX1.5 SAFETY (NEEDLE) ×2 IMPLANT
NEEDLE INSUFFLATION 14GA 120MM (NEEDLE) IMPLANT
NS IRRIG 500ML POUR BTL (IV SOLUTION) ×2 IMPLANT
PACK LAP CHOLECYSTECTOMY (MISCELLANEOUS) ×2 IMPLANT
PENCIL ELECTRO HAND CTR (MISCELLANEOUS) ×2 IMPLANT
SEAL CANN UNIV 5-8 DVNC XI (MISCELLANEOUS) ×4 IMPLANT
SEAL XI 5MM-8MM UNIVERSAL (MISCELLANEOUS) ×4
SET TUBE SMOKE EVAC HIGH FLOW (TUBING) ×2 IMPLANT
SOLUTION ELECTROLUBE (MISCELLANEOUS) ×2 IMPLANT
SPIKE FLUID TRANSFER (MISCELLANEOUS) ×2 IMPLANT
SUT MNCRL 4-0 (SUTURE) ×1
SUT MNCRL 4-0 27XMFL (SUTURE) ×1
SUT VICRYL 0 AB UR-6 (SUTURE) ×2 IMPLANT
SUTURE MNCRL 4-0 27XMF (SUTURE) ×1 IMPLANT
SYS BAG RETRIEVAL 10MM (BASKET) ×1
SYSTEM BAG RETRIEVAL 10MM (BASKET) ×1 IMPLANT
TROCAR Z-THREAD FIOS 11X100 BL (TROCAR) IMPLANT
WATER STERILE IRR 500ML POUR (IV SOLUTION) ×1 IMPLANT

## 2021-09-16 NOTE — Progress Notes (Signed)
Patient awake/alert, sleepy per patient. Able to tolerated crackers and gingerale without event. ?Vitals stable, reviewed procedure and pain management with patient, verbalizes understanding. ?

## 2021-09-16 NOTE — Anesthesia Procedure Notes (Signed)
Procedure Name: Intubation ?Date/Time: 09/16/2021 3:17 PM ?Performed by: Philbert Riser, CRNA ?Pre-anesthesia Checklist: Patient identified, Patient being monitored, Timeout performed, Emergency Drugs available and Suction available ?Patient Re-evaluated:Patient Re-evaluated prior to induction ?Oxygen Delivery Method: Circle system utilized ?Preoxygenation: Pre-oxygenation with 100% oxygen ?Induction Type: IV induction ?Ventilation: Mask ventilation without difficulty ?Laryngoscope Size: McGraph ?Grade View: Grade I ?Tube type: Oral ?Tube size: 7.0 mm ?Number of attempts: 1 ?Airway Equipment and Method: Stylet ?Placement Confirmation: ETT inserted through vocal cords under direct vision, positive ETCO2 and breath sounds checked- equal and bilateral ?Secured at: 21 cm ?Tube secured with: Tape ?Dental Injury: Teeth and Oropharynx as per pre-operative assessment  ? ? ? ? ?

## 2021-09-16 NOTE — Transfer of Care (Signed)
Immediate Anesthesia Transfer of Care Note ? ?Patient: Kristi [name] ? ?Procedure(s) Performed: XI ROBOTIC ASSISTED LAPAROSCOPIC CHOLECYSTECTOMY (Abdomen) ?INDOCYANINE GREEN FLUORESCENCE IMAGING (ICG) (Abdomen) ? ?Patient Location: PACU ? ?Anesthesia Type:General ? ?Level of Consciousness: sedated ? ?Airway & Oxygen Therapy: Patient Spontanous Breathing and Patient connected to face mask oxygen ? ?Post-op Assessment: Report given to RN and Post -op Vital signs reviewed and stable ? ?Post vital signs: Reviewed and stable ? ?Last Vitals:  ?Vitals Value Taken Time  ?BP 122/83 09/16/21 1645  ?Temp    ?Pulse 86 09/16/21 1646  ?Resp 17 09/16/21 1646  ?SpO2 100 % 09/16/21 1646  ?Vitals shown include unvalidated device data. ? ?Last Pain:  ?Vitals:  ? 09/16/21 1331  ?TempSrc: Oral  ?PainSc: 0-No pain  ?   ? ?Patients Stated Pain Goal: 0 (09/16/21 1331) ? ?Complications: No notable events documented. ?

## 2021-09-16 NOTE — Discharge Instructions (Signed)

## 2021-09-16 NOTE — Interval H&P Note (Signed)
History and Physical Interval Note: ? ?09/16/2021 ?3:01 PM ? ?Kristi [name]  has presented today for surgery, with the diagnosis of chronic calculous cholecystitis.  The various methods of treatment have been discussed with the patient and family. After consideration of risks, benefits and other options for treatment, the patient has consented to  Procedure(s): ?XI ROBOTIC ASSISTED LAPAROSCOPIC CHOLECYSTECTOMY (N/A) ?INDOCYANINE GREEN FLUORESCENCE IMAGING (ICG) (N/A) as a surgical intervention.  The patient's history has been reviewed, patient examined, no change in status, stable for surgery.  I have reviewed the patient's chart and labs.  Questions were answered to the patient's satisfaction.   ? ? ?Kristi [name] ? ? ?

## 2021-09-16 NOTE — Op Note (Signed)
Robotic cholecystectomy with Indocyamine Green Ductal Imaging.  ? ?Pre-operative Diagnosis: Chronic calculus cholecystitis ? ?Post-operative Diagnosis:  Same. ? ?Procedure: Robotic assisted laparoscopic cholecystectomy with Indocyamine Green Ductal Imaging.  ? ?Surgeon: Campbell Lerner, M.D., FACS ? ?Anesthesia: General. with endotracheal tube ? ?Findings: c/w Chronic calculus cholecystitis with biliary colic ? ? ?Estimated Blood Loss: 15 mL ?        ?Drains: None ?        ?Specimens: Gallbladder     ?      ?Complications: none ? ?Procedure Details  ?The patient was seen again in the Holding Room.  2.5 mg dose of ICG was administered intravenously.   ?The benefits, complications, treatment options, risks and expected outcomes were again reviewed with the patient. The likelihood of improving the patient's symptoms with return to their baseline status is good.  The patient and/or family concurred with the proposed plan, giving informed consent, again alternatives reviewed.  The patient was taken to Operating Room, identified, and the procedure verified as robotic assisted laparoscopic cholecystectomy. ? ?Prior to the induction of general anesthesia, antibiotic prophylaxis was administered. VTE prophylaxis was in place. General endotracheal anesthesia was then administered and tolerated well. The patient was positioned in the supine position.  After the induction, the abdomen was prepped with Chloraprep and draped in the sterile fashion.  ?A Time Out was held and the above information confirmed. ?After local infiltration of quarter percent Marcaine with epinephrine, stab incision was made left upper quadrant.  Just below the costal margin at Palmer's point, approximately midclavicular line the Veres needle is passed with sensation of the layers to penetrate the abdominal wall and into the peritoneum.  Saline drop test is confirmed peritoneal placement.  Insufflation is initiated with carbon dioxide to pressures of 15  mmHg. ? ?Right infra-umbilical local infiltration with quarter percent Marcaine with epinephrine is utilized.  Made a 12 mm incision on the right periumbilical site, I advanced an optical 32mm port under direct visualization into the peritoneal cavity.  Once the peritoneum was penetrated, insufflation was initiated.  The trocar was then advanced into the abdominal cavity under direct visualization. ?Pneumoperitoneum was then continued utilizing CO2 at 15 mmHg or less and tolerated well without any adverse changes in the patient's vital signs.  Two 8.5-mm ports were placed in the left lower quadrant and laterally, and one to the right lower quadrant, all under direct vision. All skin incisions  were infiltrated with a local anesthetic agent before making the incision and placing the trocars.  ?The patient was positioned  in reverse Trendelenburg, tilted the patient's left side down.  Da Vinci XI robot was then positioned on to the patient's left side, and docked. ? ?The gallbladder was identified, the fundus grasped via the arm 4 Prograsp and retracted cephalad. Adhesions were lysed with scissors and cautery.  The infundibulum was identified grasped and retracted laterally, exposing the peritoneum overlying the triangle of Calot. This was then opened and dissected using cautery & scissors. An extended critical view of the cystic duct and cystic artery was obtained, aided by the ICG via FireFly which enabled ready visualization of the ductal anatomy.   ? ?The cystic duct was clearly identified and dissected to isolation.   Artery well isolated and clipped, and the cystic duct was triple clipped and divided with scissors, as close to the gallbladder neck as feasible, thus leaving two on the remaining stump.  The specimen side of the artery is sealed with bipolar and divided  with monopolar scissors.  ? ?The gallbladder was taken from the gallbladder fossa in a retrograde fashion with the electrocautery. The gallbladder  was removed and placed in an Endocatch bag.  ?The liver bed is inspected. Hemostasis was confirmed.  ?The robot was undocked and moved away from the operative field. ?No irrigation was utilized.  The gallbladder and Endocatch sac were then removed through the infraumbilical port site.  ? ?Inspection of the right upper quadrant was performed. No bleeding, bile duct injury or leak, or bowel injury was noted. The infra-umbilical port site fascia was closed with interrumpted 0 Vicryl sutures using PMI/cone under direct visualization. Pneumoperitoneum was released and ports removed.  4-0 subcuticular Monocryl was used to close the skin. Dermabond was  applied.  The patient was then extubated and brought to the recovery room in stable condition. Sponge, lap, and needle counts were correct at closure and at the conclusion of the case.  ?        ?     ?Campbell Lerner, M.D., FACS ?09/16/2021 4:29 PM  ?

## 2021-09-16 NOTE — Anesthesia Preprocedure Evaluation (Addendum)
Anesthesia Evaluation  ?Patient identified by MRN, date of birth, ID band ?Patient awake ? ? ? ?Reviewed: ?Allergy & Precautions, NPO status , Patient's Chart, lab work & pertinent test results ? ?History of Anesthesia Complications ?Negative for: history of anesthetic complications ? ?Airway ?Mallampati: II ? ?TM Distance: >3 FB ?Neck ROM: Full ? ? ? Dental ?no notable dental hx. ? ?  ?Pulmonary ?neg pulmonary ROS, neg sleep apnea, neg COPD,  ?  ?breath sounds clear to auscultation- rhonchi ?(-) wheezing ? ? ? ? ? Cardiovascular ?Exercise Tolerance: Good ?(-) hypertension(-) CAD, (-) Past MI, (-) Cardiac Stents and (-) CABG  ?Rhythm:Regular Rate:Normal ?- Systolic murmurs and - Diastolic murmurs ? ?  ?Neuro/Psych ?neg Seizures negative neurological ROS ? negative psych ROS  ? GI/Hepatic ?Neg liver ROS, GERD  Controlled and Medicated,chronic calculous cholecystitis ?  ?Endo/Other  ?neg diabetesHypothyroidism  ? Renal/GU ?negative Renal ROS  ? ?  ?Musculoskeletal ?negative musculoskeletal ROS ?(+)  ? Abdominal ?(+) + obese,   ?Peds ? Hematology ?negative hematology ROS ?(+)   ?Anesthesia Other Findings ?Past Medical History: ?No date: Acid reflux ?No date: Anemia ?    Comment:  with pregnanacies  ?No date: Anxiety ?No date: Family history of adverse reaction to anesthesia ?No date: Hyperlipidemia ?No date: Hypothyroidism ?No date: Obesity ?2011: Pap smear, low-risk ?No date: Vitamin D deficiency disease ? ? Reproductive/Obstetrics ? ?  ? ? ? ? ? ? ? ? ? ? ? ? ? ?  ?  ? ? ? ? ? ? ? ?Anesthesia Physical ? ?Anesthesia Plan ? ?ASA: 2 ? ?Anesthesia Plan: General  ? ?Post-op Pain Management: Celebrex PO (pre-op)*, Gabapentin PO (pre-op)* and Tylenol PO (pre-op)*  ? ?Induction: Intravenous ? ?PONV Risk Score and Plan: 2 and Ondansetron, Dexamethasone, Propofol infusion and Midazolam ? ?Airway Management Planned: Oral ETT ? ?Additional Equipment:  ? ?Intra-op Plan:  ? ?Post-operative Plan:   ? ?Informed Consent: I have reviewed the patients History and Physical, chart, labs and discussed the procedure including the risks, benefits and alternatives for the proposed anesthesia with the patient or authorized representative who has indicated his/her understanding and acceptance.  ? ? ? ?Dental advisory given ? ?Plan Discussed with: CRNA and Anesthesiologist ? ?Anesthesia Plan Comments:   ? ? ? ? ?Anesthesia Quick Evaluation ? ?

## 2021-09-16 NOTE — Progress Notes (Signed)
Patient transferred to post-op, after ambulating and going into BR abd discomfort 7/10  per Dr. Pernell Dupre second dose of 5mg  oxycodone po administered, will continue to monitor, tolerated gingerale and crackers without event. ?

## 2021-09-18 LAB — SURGICAL PATHOLOGY

## 2021-09-23 NOTE — Anesthesia Postprocedure Evaluation (Signed)
Anesthesia Post Note ? ?Patient: Kristi [name] ? ?Procedure(s) Performed: XI ROBOTIC ASSISTED LAPAROSCOPIC CHOLECYSTECTOMY (Abdomen) ?INDOCYANINE GREEN FLUORESCENCE IMAGING (ICG) (Abdomen) ? ?Patient location during evaluation: PACU ?Anesthesia Type: General ?Level of consciousness: awake and alert ?Pain management: pain level controlled ?Vital Signs Assessment: post-procedure vital signs reviewed and stable ?Respiratory status: spontaneous breathing, nonlabored ventilation, respiratory function stable and patient connected to nasal cannula oxygen ?Cardiovascular status: blood pressure returned to baseline and stable ?Postop Assessment: no apparent nausea or vomiting ?Anesthetic complications: no ? ? ?No notable events documented. ? ? ?Last Vitals:  ?Vitals:  ? 09/16/21 1730 09/16/21 1744  ?BP: 123/83 (P) 121/80  ?Pulse: 84 (P) 69  ?Resp: 18 (P) 15  ?Temp: (!) 36.4 ?C (!) (P) 36.1 ?C  ?SpO2: 98% (P) 100%  ?  ?Last Pain:  ?Vitals:  ? 09/16/21 1744  ?TempSrc: (P) Temporal  ?PainSc: (P) 8   ? ? ?  ?  ?  ?  ?  ?  ? ?Molli Barrows ? ? ? ? ?

## 2021-10-01 ENCOUNTER — Other Ambulatory Visit: Payer: Self-pay

## 2021-10-01 ENCOUNTER — Ambulatory Visit (INDEPENDENT_AMBULATORY_CARE_PROVIDER_SITE_OTHER): Payer: BC Managed Care – PPO | Admitting: Surgery

## 2021-10-01 ENCOUNTER — Encounter: Payer: Self-pay | Admitting: Surgery

## 2021-10-01 VITALS — BP 121/89 | HR 68 | Temp 98.3°F | Ht 70.0 in | Wt 243.0 lb

## 2021-10-01 DIAGNOSIS — Z9049 Acquired absence of other specified parts of digestive tract: Secondary | ICD-10-CM | POA: Insufficient documentation

## 2021-10-01 DIAGNOSIS — Z09 Encounter for follow-up examination after completed treatment for conditions other than malignant neoplasm: Secondary | ICD-10-CM

## 2021-10-01 DIAGNOSIS — K8010 Calculus of gallbladder with chronic cholecystitis without obstruction: Secondary | ICD-10-CM

## 2021-10-01 NOTE — Progress Notes (Signed)
[facility] POST-OP OFFICE VISIT  10/01/2021  HPI: Kristi [name] is a 36 y.o. female 15 days s/p robotic cholecystectomy with ICG imaging.  She reports she is doing well.  Denies nausea vomiting, anorexia.  She is having some degree of worsening pyrosis, for which she is taking a PPI more frequently.  She reports she is still eating cautiously avoiding fats.  Vital signs: BP 121/89   Pulse 68   Temp 98.3 F (36.8 C) (Oral)   Ht 5\' 10"  (1.778 m)   Wt 243 lb (110.2 kg)   SpO2 99%   BMI 34.87 kg/m    Physical Exam: Constitutional: Appears well Abdomen: Soft benign nontender Skin: Incisions healing well.  Assessment/Plan: This is a 36 y.o. female 15 days s/p robotic cholecystectomy  Patient Active Problem List   Diagnosis Date Noted   Anxiety 04/21/2020   History of 2019 novel coronavirus disease (COVID-19) 05/24/2019   Vitamin D deficiency disease 09/03/2014   Hypothyroidism 01/22/2011    -We discussed other measures to help address GERD.  Hoping she may remain well medically managed.   01/24/2011 M.D., FACS 10/01/2021, 1:03 PM

## 2021-10-01 NOTE — Patient Instructions (Signed)

## 2022-02-22 ENCOUNTER — Encounter: Payer: Self-pay | Admitting: Certified Nurse Midwife

## 2022-04-03 IMAGING — XA DG C-ARM 1-60 MIN
3 series · 3 of 3 positions shown · non-contrast
Comparison: None.

CLINICAL DATA: Left knee surgery.

EXAM:
LEFT KNEE - 1-2 VIEW; DG C-ARM 1-60 MIN
Radiation exposure index: 2.1 mGy.

[Series 1: ortho standard · 1 of 1 slices shown (1 of 3)]
[im 1/1]
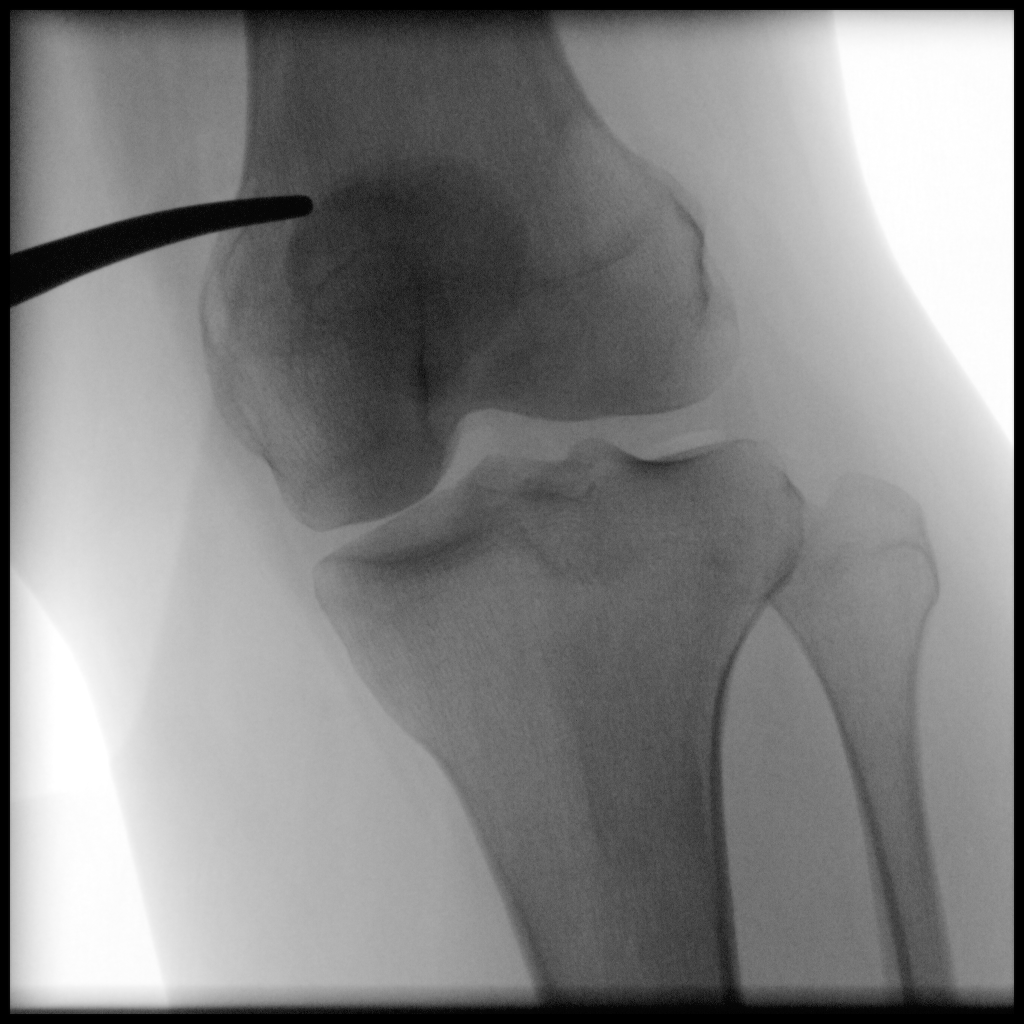

[Series 2: ortho standard · 1 of 1 slices shown (2 of 3)]
[im 1/1]
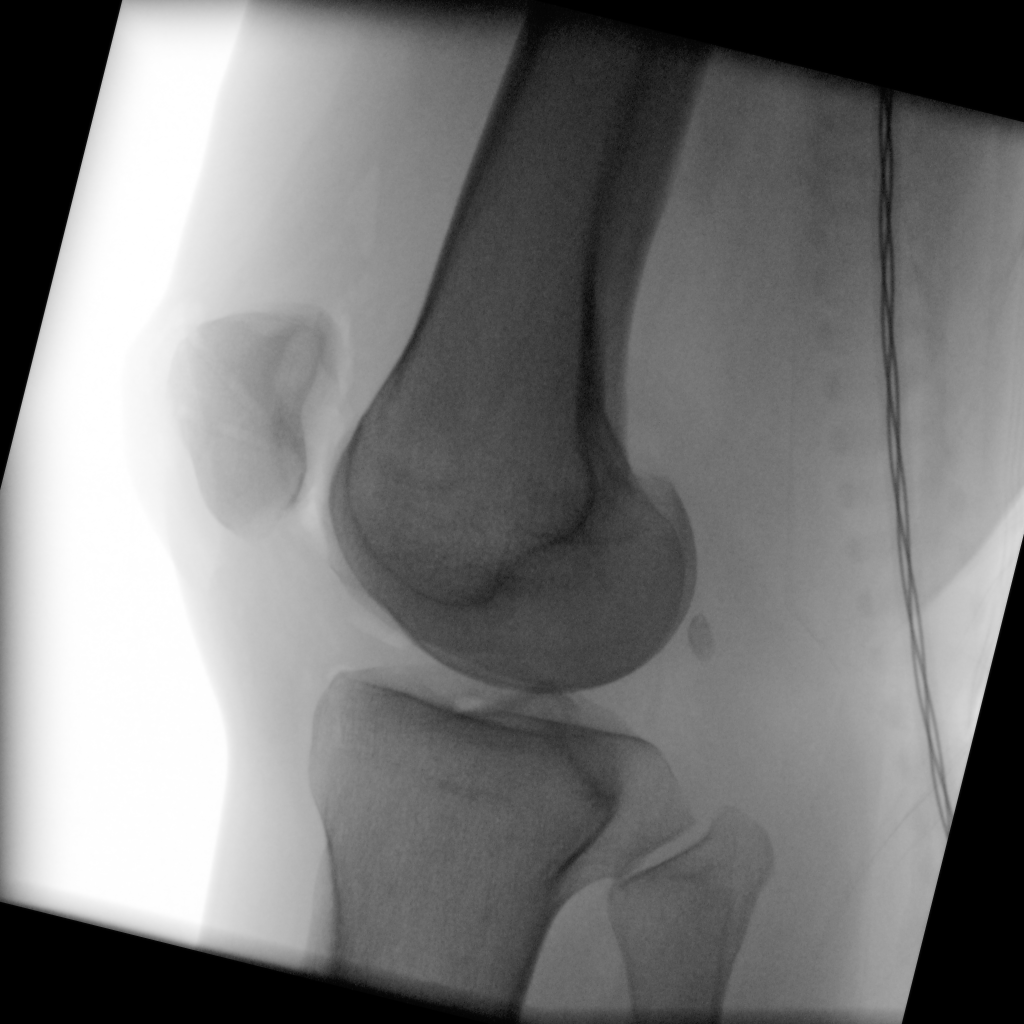

[Series 3: ortho standard · 1 of 1 slices shown (3 of 3)]
[im 1/1]
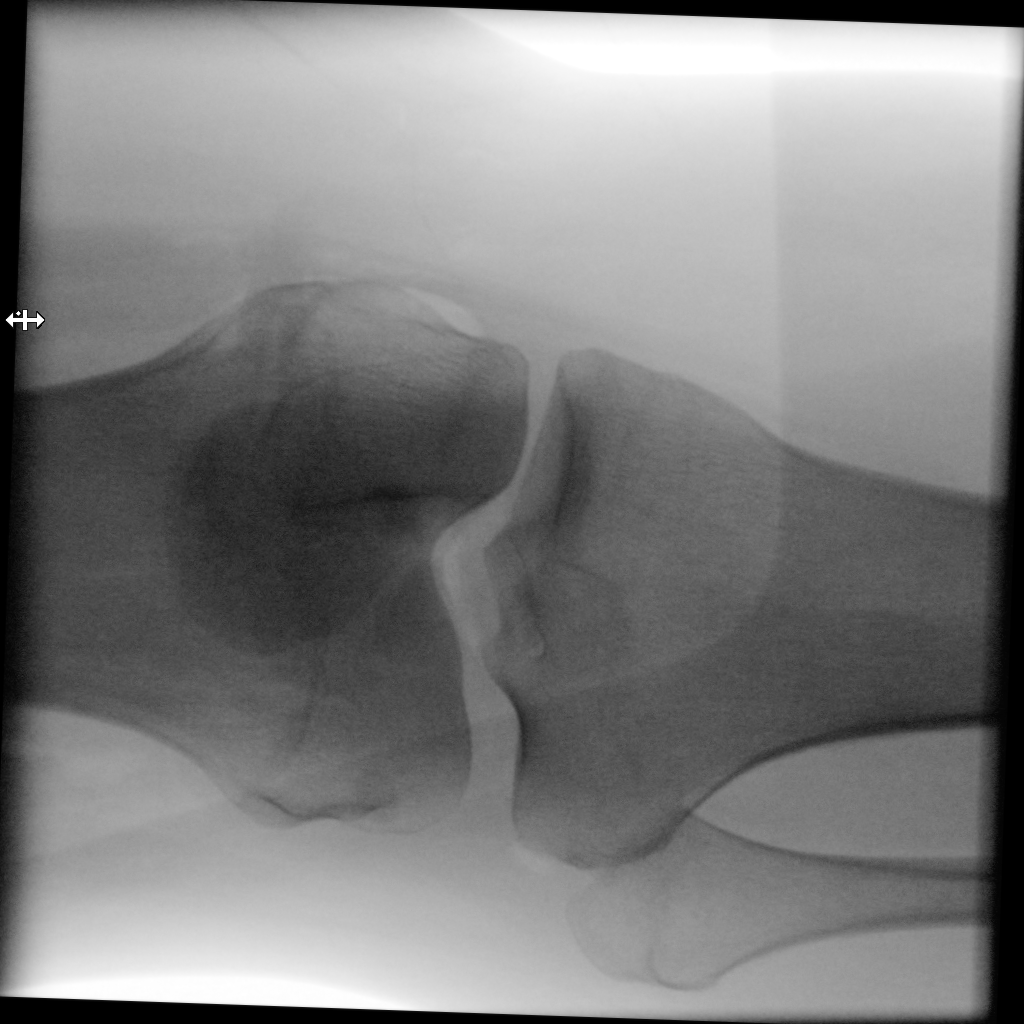

[3 of 3 positions shown; findings below may reference images not displayed]

FINDINGS: Three intraoperative fluoroscopic images were obtained of the right
knee.
IMPRESSION: Fluoroscopic guidance provided during right knee surgery.

## 2022-06-17 IMAGING — CT CT RENAL STONE PROTOCOL
3 of 4 series · 8 of 46 positions shown, 15 images · non-contrast
Comparison: CT of the abdomen pelvis dated 07/07/2017.

CLINICAL DATA: 35-year-old female with flank pain. Concern for
kidney stone.

EXAM:
CT ABDOMEN AND PELVIS WITHOUT CONTRAST
TECHNIQUE: Multidetector CT imaging of the abdomen and pelvis was performed
following the standard protocol without IV contrast.

[Series 4: lung bases · axial · 0.78mm/px · z∈[-571,-501]mm · 4 of 24 slices shown, 9 images]
[im 5/24  soft-tissue]
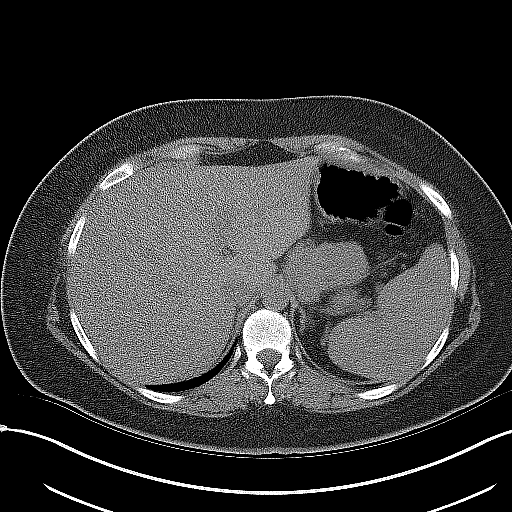
[im 5/24  lung]
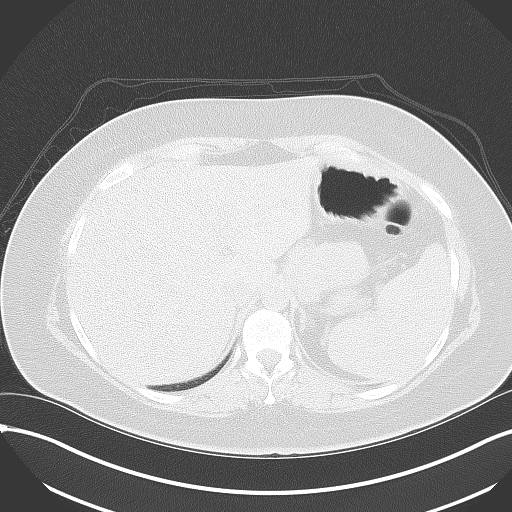
[im 5/24  bone]
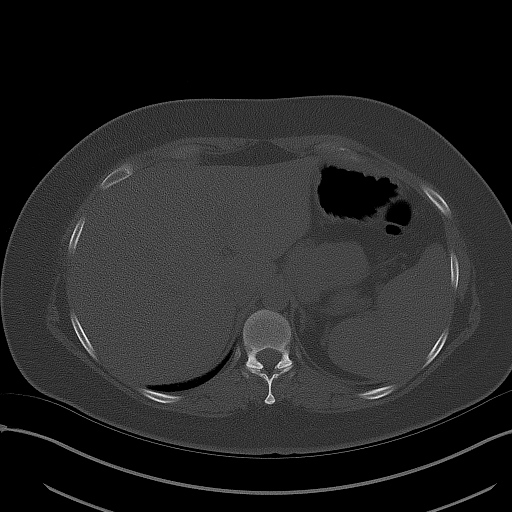
[im 10/24  soft-tissue]
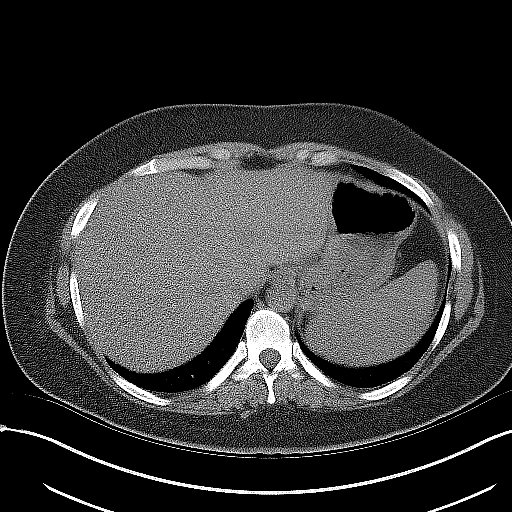
[im 10/24  lung]
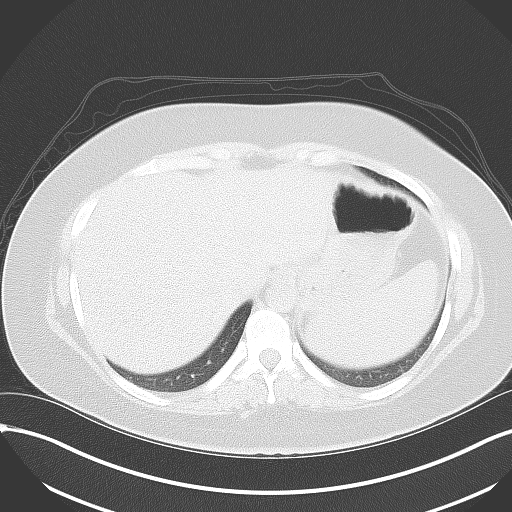
[im 14/24  soft-tissue]
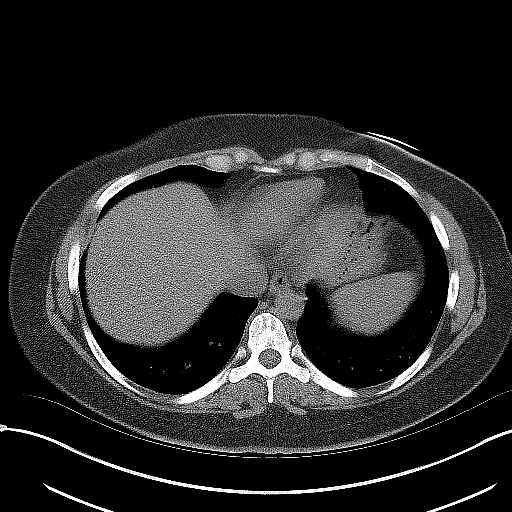
[im 14/24  lung]
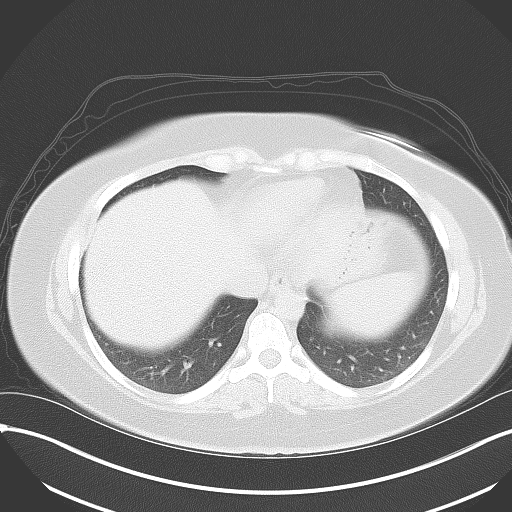
[im 19/24  soft-tissue]
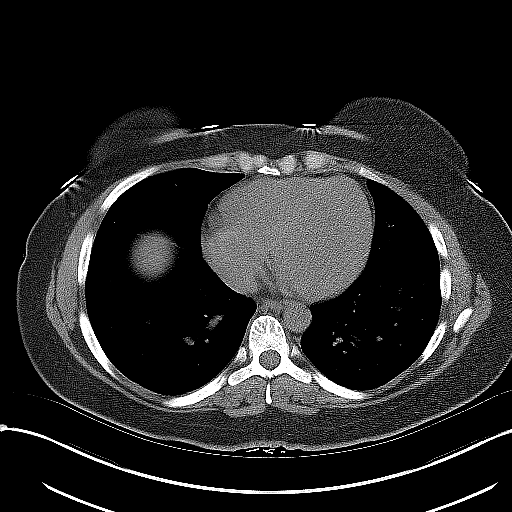
[im 19/24  lung]
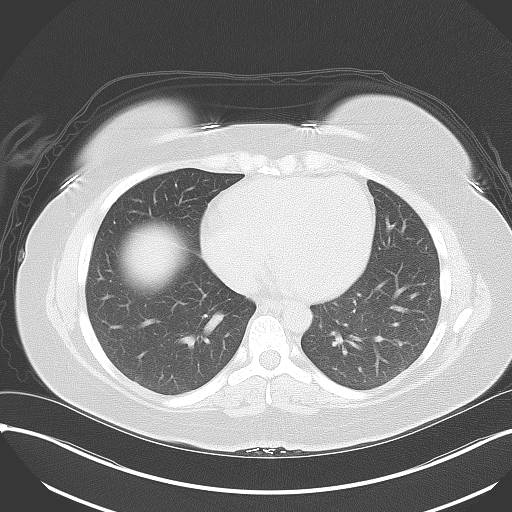

[Series 5: coronal · coronal · 0.82mm/px · 3 of 148 slices shown, 4 images]
[im 50/148  soft-tissue]
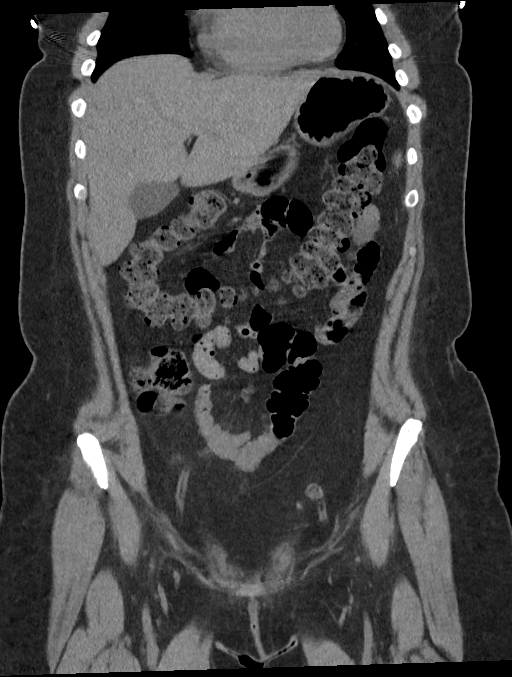
[im 66/148  soft-tissue]
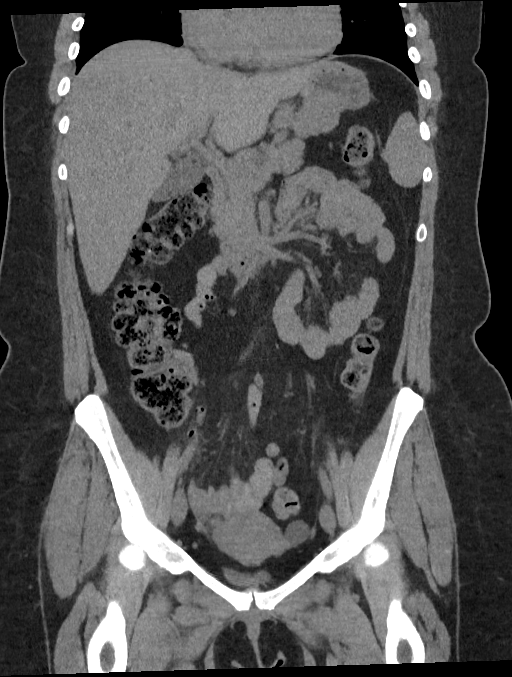
[im 66/148  bone]
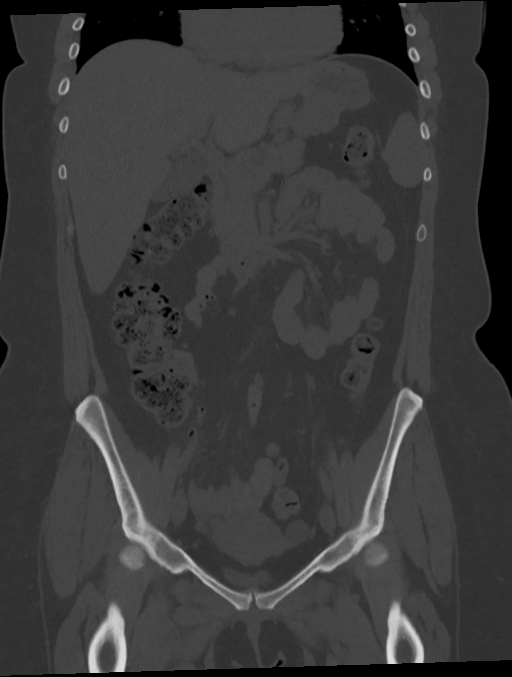
[im 82/148  soft-tissue]
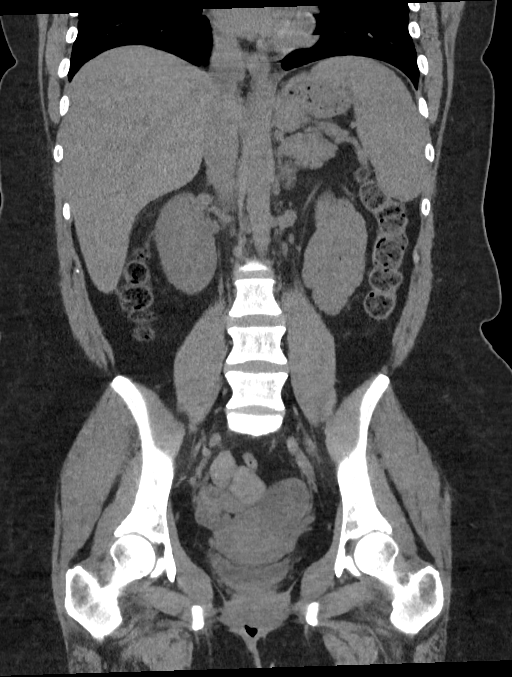

[Series 6: sagittal · sagittal · 0.58mm/px · 1 of 212 slices shown, 2 images]
[im 71/212  soft-tissue]
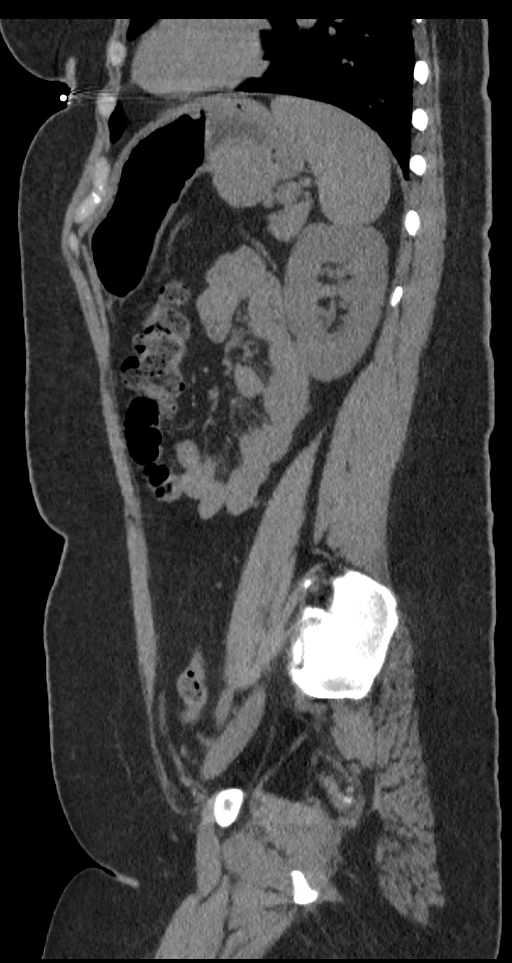
[im 71/212  bone]
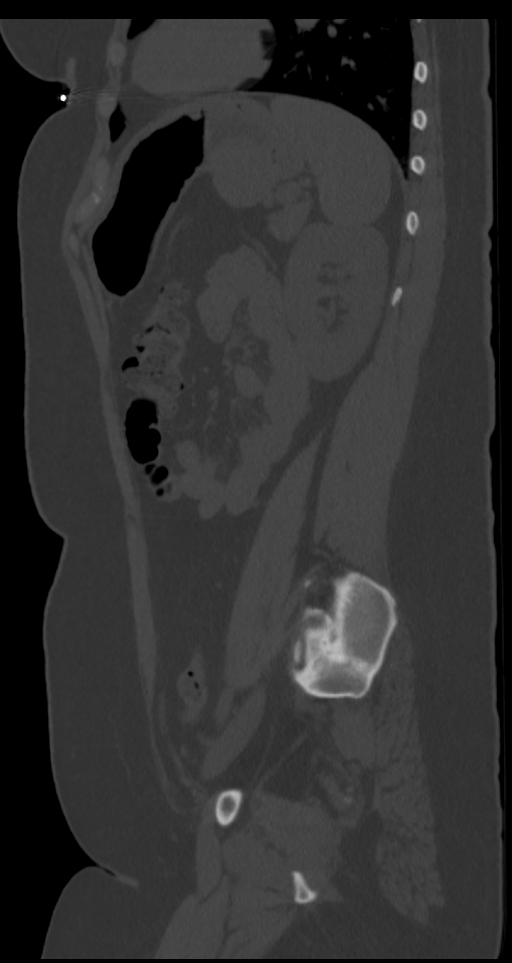

[8 of 46 positions shown; findings below may reference images not displayed]

FINDINGS: Evaluation of this exam is limited in the absence of intravenous
contrast.

Lower chest: The visualized lung bases are clear.

No intra-abdominal free air or free fluid.

Hepatobiliary: The liver is unremarkable. No intrahepatic biliary
ductal dilatation. Multiple small gallstones. No pericholecystic
fluid or evidence of acute cholecystitis by CT.

Pancreas: Unremarkable. No pancreatic ductal dilatation or
surrounding inflammatory changes.

Spleen: Normal in size without focal abnormality.

Adrenals/Urinary Tract: The adrenal glands unremarkable. There is no
hydronephrosis or nephrolithiasis on either side. The visualized
ureters and urinary bladder appear unremarkable. A 2 mm calculus in
the right hemipelvis along the expected course of the distal right
ureter (88/2) favored to represent a pelvic phlebolith and less
likely a nonobstructing distal ureteral stone. This is new since the
CT of 5704. There is mild right perinephric stranding, nonspecific.
Correlation with urinalysis recommended to exclude UTI.

Stomach/Bowel: There is moderate stool throughout the colon. There
is no bowel obstruction or active inflammation. The appendix is
normal.

Vascular/Lymphatic: The abdominal aorta and IVC are unremarkable. No
portal venous gas. There is no adenopathy.

Reproductive: The uterus is anteverted and grossly unremarkable.
Multiple left ovarian cysts measure up to 3 cm. The right ovary is
unremarkable. Pelvic ultrasound may provide better evaluation if
clinically indicated.

Other: Small fat containing umbilical hernia.

Musculoskeletal: No acute or significant osseous findings.
IMPRESSION: 1. No hydronephrosis or nephrolithiasis. A 2 mm pelvic phlebolith
and less likely a nonobstructing distal right ureteral stone.
2. Mild right perinephric stranding, nonspecific. Correlation with
urinalysis recommended to exclude UTI.
3. Cholelithiasis.
4. No bowel obstruction. Normal appendix.
5. Multiple left ovarian cysts measure up to 3 cm. Pelvic ultrasound
may provide better evaluation if clinically indicated.

## 2022-08-30 ENCOUNTER — Other Ambulatory Visit: Payer: Self-pay | Admitting: Obstetrics and Gynecology

## 2024-01-25 ENCOUNTER — Other Ambulatory Visit: Payer: Self-pay | Admitting: Obstetrics and Gynecology

## 2024-01-25 DIAGNOSIS — R928 Other abnormal and inconclusive findings on diagnostic imaging of breast: Secondary | ICD-10-CM

## 2024-01-30 ENCOUNTER — Ambulatory Visit
Admission: RE | Admit: 2024-01-30 | Discharge: 2024-01-30 | Disposition: A | Discharge status: Home / Self Care | Source: Ambulatory Visit | Attending: Obstetrics and Gynecology | Admitting: Obstetrics and Gynecology

## 2024-01-30 ENCOUNTER — Other Ambulatory Visit: Payer: Self-pay | Admitting: Obstetrics and Gynecology

## 2024-01-30 DIAGNOSIS — R928 Other abnormal and inconclusive findings on diagnostic imaging of breast: Secondary | ICD-10-CM

## 2024-01-30 DIAGNOSIS — N6323 Unspecified lump in the left breast, lower outer quadrant: Secondary | ICD-10-CM

## 2024-01-30 HISTORY — PX: BREAST BIOPSY: SHX20

## 2024-01-31 LAB — SURGICAL PATHOLOGY
